# Patient Record
Sex: Male | Born: 1960 | Race: Black or African American | Hispanic: No | Marital: Married | Smoking: Never smoker
Health system: Southern US, Community
[De-identification: ages and names within clinical notes are randomized; demographics above are authoritative.]

## PROBLEM LIST (undated history)

## (undated) DIAGNOSIS — D869 Sarcoidosis, unspecified: Secondary | ICD-10-CM

## (undated) HISTORY — PX: BACK SURGERY: SHX140

---

## 2000-06-03 ENCOUNTER — Emergency Department (HOSPITAL_COMMUNITY): Admission: EM | Admit: 2000-06-03 | Discharge: 2000-06-04 | Payer: Self-pay | Admitting: Emergency Medicine

## 2000-06-03 ENCOUNTER — Encounter: Payer: Self-pay | Admitting: Internal Medicine

## 2001-02-14 ENCOUNTER — Emergency Department (HOSPITAL_COMMUNITY): Admission: EM | Admit: 2001-02-14 | Discharge: 2001-02-14 | Payer: Self-pay | Admitting: Emergency Medicine

## 2001-02-14 ENCOUNTER — Encounter: Payer: Self-pay | Admitting: Emergency Medicine

## 2001-06-10 ENCOUNTER — Ambulatory Visit: Admission: RE | Admit: 2001-06-10 | Discharge: 2001-06-10 | Payer: Self-pay | Admitting: Pulmonary Disease

## 2001-06-22 ENCOUNTER — Encounter: Admission: RE | Admit: 2001-06-22 | Discharge: 2001-06-22 | Payer: Self-pay | Admitting: Internal Medicine

## 2001-06-22 ENCOUNTER — Encounter: Payer: Self-pay | Admitting: Internal Medicine

## 2001-08-23 ENCOUNTER — Ambulatory Visit (HOSPITAL_COMMUNITY): Admission: RE | Admit: 2001-08-23 | Discharge: 2001-08-23 | Payer: Self-pay | Admitting: Internal Medicine

## 2001-08-23 ENCOUNTER — Encounter: Payer: Self-pay | Admitting: Internal Medicine

## 2002-07-17 ENCOUNTER — Encounter: Admission: RE | Admit: 2002-07-17 | Discharge: 2002-07-17 | Payer: Self-pay | Admitting: Internal Medicine

## 2002-07-17 ENCOUNTER — Encounter: Payer: Self-pay | Admitting: Internal Medicine

## 2010-10-08 ENCOUNTER — Emergency Department (HOSPITAL_COMMUNITY)
Admission: EM | Admit: 2010-10-08 | Discharge: 2010-10-09 | Disposition: A | Discharge status: Home / Self Care | Payer: No Typology Code available for payment source | Attending: Emergency Medicine | Admitting: Emergency Medicine

## 2010-10-08 DIAGNOSIS — M549 Dorsalgia, unspecified: Secondary | ICD-10-CM | POA: Insufficient documentation

## 2010-10-08 DIAGNOSIS — S335XXA Sprain of ligaments of lumbar spine, initial encounter: Secondary | ICD-10-CM | POA: Insufficient documentation

## 2016-09-01 ENCOUNTER — Ambulatory Visit: Payer: Self-pay | Admitting: Podiatry

## 2016-10-06 ENCOUNTER — Ambulatory Visit (INDEPENDENT_AMBULATORY_CARE_PROVIDER_SITE_OTHER): Payer: Medicare Other

## 2016-10-06 ENCOUNTER — Encounter: Payer: Self-pay | Admitting: Podiatry

## 2016-10-06 ENCOUNTER — Ambulatory Visit (INDEPENDENT_AMBULATORY_CARE_PROVIDER_SITE_OTHER): Payer: Medicare Other | Admitting: Podiatry

## 2016-10-06 DIAGNOSIS — M2010 Hallux valgus (acquired), unspecified foot: Secondary | ICD-10-CM | POA: Diagnosis not present

## 2016-10-06 NOTE — Patient Instructions (Signed)

## 2016-10-06 NOTE — Progress Notes (Signed)
   Subjective:    Patient ID: Connor Baldwin, male    DOB: [birth year], 56 y.o.   MRN: [id number]  HPI  Chief Complaint  Patient presents with  . Bunions    BL onset 25 years   He presents today with a 25 year history of bunions he says it will bother me since the [employer]. The right one seems to be the worst. He states it feels like him walking on of bone. He's try to accommodate with appropriate shoe gear but he is still limited in his activities and ability to perform his daily duties.    Review of Systems  Eyes: Positive for itching.  Musculoskeletal: Positive for arthralgias, back pain and myalgias.  Psychiatric/Behavioral:       PTSD  All other systems reviewed and are negative.      Objective:   Physical Exam: Vital signs are stable he is alert and oriented 3. Neurologic system is intact vascular status is intact deep tendon reflexes are intact muscle strength is normal bilateral. Orthopedic evaluation shows almost distal to the ankle for range of motion or crepitus mild pes planus bilateral. He has HAV deformities bilateral that appear to be marginally hypermobile with pain on palpation and range of motion of the first metatarsophalangeal joint right greater than left. Radiographs taken today 3 views of the office demonstrates severe increase in the first metatarsal angle with severe hallux abductus. Right greater than left. Also demonstrates pes planus. No more osseous other modalities are noted.        Assessment & Plan:  Assessment hallux abductovalgus deformity with pes planus. Right greater than left.  Plan: Discussed etiology pathology conservative versus surgical therapies. Scheduled him for a Lapidus procedure with a cast. We did discuss the alternative treatment consisting of a Austin osteotomy if possible. He understands this is amenable to it we did discuss the possible postop palpitations which may include but are not limited to postop pain bleeding swelling  infection recurrence from surgery overcorrection and under correction loss of digit loss of limb loss of life. He is an occupational consent form we gave him paperwork regarding the surgery center and anesthesia group.

## 2016-10-13 ENCOUNTER — Telehealth: Payer: Self-pay

## 2016-10-13 NOTE — Telephone Encounter (Signed)
LVM returning patient's call that he wants to reschedule surgery from 10/23/16 to the first Friday in September.

## 2016-10-19 ENCOUNTER — Telehealth: Payer: Self-pay | Admitting: *Deleted

## 2016-10-19 NOTE — Telephone Encounter (Signed)
I am calling to verify that you wanted to change your surgery date from August 10 to September.  "I think Shanda BumpsJessica already did it."  So you're moving to November 20, 2016?  "Yes, that's correct."

## 2016-10-29 ENCOUNTER — Ambulatory Visit: Payer: Medicare Other

## 2016-11-19 ENCOUNTER — Other Ambulatory Visit: Payer: Self-pay | Admitting: Podiatry

## 2016-11-19 MED ORDER — OXYCODONE-ACETAMINOPHEN 10-325 MG PO TABS: 1.0000 | ORAL_TABLET | ORAL | 0 refills | Status: DC | PRN

## 2016-11-19 MED ORDER — PROMETHAZINE HCL 25 MG PO TABS: 25.0000 mg | ORAL_TABLET | Freq: Three times a day (TID) | ORAL | 0 refills | Status: AC | PRN

## 2016-11-19 MED ORDER — CEPHALEXIN 500 MG PO CAPS: 500.0000 mg | ORAL_CAPSULE | Freq: Three times a day (TID) | ORAL | 0 refills | Status: DC

## 2016-11-20 ENCOUNTER — Encounter: Payer: Self-pay | Admitting: Podiatry

## 2016-11-20 DIAGNOSIS — M2011 Hallux valgus (acquired), right foot: Secondary | ICD-10-CM | POA: Diagnosis not present

## 2016-11-26 ENCOUNTER — Ambulatory Visit (INDEPENDENT_AMBULATORY_CARE_PROVIDER_SITE_OTHER): Payer: Medicare Other

## 2016-11-26 ENCOUNTER — Encounter: Payer: Self-pay | Admitting: Podiatry

## 2016-11-26 ENCOUNTER — Ambulatory Visit (INDEPENDENT_AMBULATORY_CARE_PROVIDER_SITE_OTHER): Payer: Self-pay | Admitting: Podiatry

## 2016-11-26 VITALS — BP 163/90 | HR 72 | Resp 16

## 2016-11-26 DIAGNOSIS — M2011 Hallux valgus (acquired), right foot: Secondary | ICD-10-CM

## 2016-11-26 NOTE — Progress Notes (Signed)
He presents today for his first postop visit [facility]. He states it is doing just fine. He denies fever chills nausea vomiting muscle aches and pains denies shortness of breath chest pain.  Objective: Date of surgery 11/20/2016 dry sterile dressing intact was removed reveals mild edema no erythema cellulitis drainage or odor knee has good range of motion of the first metatarsophalangeal joint actively and passively. Radiographs 3 views of the right foot taken today in the office demonstrates an osseously mature foot with osteotomy of the first metatarsal and internal fixation with the screw. Is in good alignment and good position and appears to be good compression.  Assessment: Well-healing surgical foot 1 week.  Plan: Redressed today dresser compressive dressing continue use of cam walker and I will follow-up with him in 1 week.

## 2016-12-03 ENCOUNTER — Ambulatory Visit (INDEPENDENT_AMBULATORY_CARE_PROVIDER_SITE_OTHER): Payer: Medicare Other | Admitting: Podiatry

## 2016-12-03 DIAGNOSIS — M2011 Hallux valgus (acquired), right foot: Secondary | ICD-10-CM | POA: Diagnosis not present

## 2016-12-03 DIAGNOSIS — M2010 Hallux valgus (acquired), unspecified foot: Secondary | ICD-10-CM

## 2016-12-03 DIAGNOSIS — M2012 Hallux valgus (acquired), left foot: Secondary | ICD-10-CM

## 2016-12-03 NOTE — Progress Notes (Signed)
He presents today for follow-up status post [facility] bunion repair right foot. Date of surgery 11/20/2016. He states that he would like to consider having his left foot corrected in the near future. We have discussed this left the previously and other notes. He states that his right foot is doing very well he denies fever chills nausea vomiting muscle aches and pains.  Objective: Vital signs are stable he is alert and oriented 3 sutures are intact margins well coapted has great range of motion of the first metatarsophalangeal joint right foot. No signs of infection. He has pain on range of motion and on palpation of the first metatarsophalangeal joint of the left foot. Radiographs for this were reviewed.  Assessment: Well-healing surgical foot right. Hallux abductovalgus deformity left leg.  Plan: Discussed etiology pathology conservative versus surgical therapies. At this point he signed a consent form for his left foot just the same as the right. He knows the possible postop implications which may include but are not limited to postop pain bleeding swelling section recurrence need for further surgery overcorrection under correction loss of digit loss of limb loss of life. We placed his right foot and a compression anklet today and a Darco shoe will follow up with him in 2 weeks for another postop visit and less we do surgery on the left foot earlier.

## 2016-12-17 ENCOUNTER — Ambulatory Visit (INDEPENDENT_AMBULATORY_CARE_PROVIDER_SITE_OTHER): Payer: Medicare Other | Admitting: Podiatry

## 2016-12-17 ENCOUNTER — Ambulatory Visit (INDEPENDENT_AMBULATORY_CARE_PROVIDER_SITE_OTHER): Payer: Medicare Other

## 2016-12-17 DIAGNOSIS — M2011 Hallux valgus (acquired), right foot: Secondary | ICD-10-CM

## 2016-12-18 NOTE — Progress Notes (Signed)
He presents today stating that he is starting to have some soreness particularly since being up on the floor.  Objective: Vital signs are stable he is alert and oriented 3 there is mild edema no erythema cellulitis drainage or odor has great range of motion first metatarsophalangeal joint that is somewhat tender. His wife presents with him today stating that he may be doing too much. Radiographs demonstrate well-healing osteotomy with internal fixation in good position.  Assessment: Well-healed surgical foot left.  Plan: Discussed etiology pathology conservative versus surgical therapies. I encouraged range of motion exercises and will allow him back into a regular shoe.

## 2016-12-22 NOTE — Progress Notes (Signed)
DOS 11/20/16 Lapidus procedure including bunionectomy Rt foot w cast

## 2017-01-20 ENCOUNTER — Other Ambulatory Visit: Payer: Medicare Other

## 2017-01-21 ENCOUNTER — Encounter: Payer: Self-pay | Admitting: Podiatry

## 2017-01-21 ENCOUNTER — Ambulatory Visit (INDEPENDENT_AMBULATORY_CARE_PROVIDER_SITE_OTHER): Payer: Medicare Other

## 2017-01-21 ENCOUNTER — Ambulatory Visit: Payer: Medicare Other

## 2017-01-21 ENCOUNTER — Ambulatory Visit (INDEPENDENT_AMBULATORY_CARE_PROVIDER_SITE_OTHER): Payer: Medicare Other | Admitting: Podiatry

## 2017-01-21 DIAGNOSIS — M2011 Hallux valgus (acquired), right foot: Secondary | ICD-10-CM | POA: Diagnosis not present

## 2017-01-21 MED ORDER — MELOXICAM 15 MG PO TABS: 15.0000 mg | ORAL_TABLET | Freq: Every day | ORAL | 3 refills | Status: AC

## 2017-01-21 NOTE — Progress Notes (Signed)
He presents today for postop visit date of surgery 11/20/2016 status post [facility] bunion[facility] of the right foot. He states that other than some swelling as seems to be doing very well.  Objective: Vital signs are stable he is alert and oriented 3. His great range of motion of the first metatarsophalangeal joint with minimal edema. Incision site is gone on to heal uneventfully there is no signs of infection. Radiographs confirm screw fixation is intact with Y osteotomy appears to be healed.  Assessment: Well-healing surgical foot right.  Plan: We will allow him to get back to his regular activity will notify me with questions or concerns. I will follow-up with him in 1 month for his final set of x-rays if necessary.

## 2017-01-21 NOTE — Patient Instructions (Signed)

## 2017-02-18 ENCOUNTER — Ambulatory Visit (INDEPENDENT_AMBULATORY_CARE_PROVIDER_SITE_OTHER): Payer: Medicare Other

## 2017-02-18 ENCOUNTER — Ambulatory Visit (INDEPENDENT_AMBULATORY_CARE_PROVIDER_SITE_OTHER): Payer: Medicare Other | Admitting: Podiatry

## 2017-02-18 ENCOUNTER — Encounter: Payer: Self-pay | Admitting: Podiatry

## 2017-02-18 DIAGNOSIS — M2012 Hallux valgus (acquired), left foot: Secondary | ICD-10-CM

## 2017-02-18 DIAGNOSIS — M2011 Hallux valgus (acquired), right foot: Secondary | ICD-10-CM | POA: Diagnosis not present

## 2017-02-18 NOTE — Progress Notes (Signed)
He presents today for surgical consult regarding his left foot.  He states that he would like to go ahead and have this taken care of.  He states that it started bothering him more with regular shoe gear is starting to affect his ability to perform his daily activities.  We performed a bunion repair to his right foot in September he is doing very well with that.  Objective: Vital signs are stable he is alert and oriented x3.  I have reviewed his past medical history medications allergies surgeries and social history.  There is been no changes in his past medical history.  Pulses are strongly palpable.  Hallux abductovalgus deformity is present.  I reviewed radiographs from July 2018 for the left foot.  Increase in the first intermetatarsal angle is present and above normal values as well as hallux abductus angles greater than normal values.  Assessment: Hallux abductovalgus deformity well-healing right in need of surgical repair left.  Plan: At this point we consented [facility] today [facility] with double screw fixation he understands this and is amenable to it.  We did once again discussed the possible postop complications which may include but are not limited to postop pain bleeding swelling infection recurrence and need for further surgery.  He understands this and is amenable to it.  He was provided both oral and written home-going instructions for his preoperative.

## 2017-02-18 NOTE — Patient Instructions (Signed)
Pre-Operative Instructions  Congratulations, you have decided to take an important step towards improving your quality of life.  You can be assured that the doctors and staff at [facility] will be with you every step of the way.  Here are some important things you should know:  1. Plan to be at the surgery center/hospital at least 1 (one) hour prior to your scheduled time, unless otherwise directed by the surgical center/hospital staff.  You must have a responsible adult accompany you, remain during the surgery and drive you home.  Make sure you have directions to the surgical center/hospital to ensure you arrive on time. 2. If you are having surgery at [facility] or [facility], you will need a copy of your medical history and physical form from your family physician within one month prior to the date of surgery. We will give you a form for your primary physician to complete.  3. We make every effort to accommodate the date you request for surgery.  However, there are times where surgery dates or times have to be moved.  We will contact you as soon as possible if a change in schedule is required.   4. No aspirin/ibuprofen for one week before surgery.  If you are on aspirin, any non-steroidal anti-inflammatory medications (Mobic, Aleve, Ibuprofen) should not be taken seven (7) days prior to your surgery.  You make take Tylenol for pain prior to surgery.  5. Medications - If you are taking daily heart and blood pressure medications, seizure, reflux, allergy, asthma, anxiety, pain or diabetes medications, make sure you notify the surgery center/hospital before the day of surgery so they can tell you which medications you should take or avoid the day of surgery. 6. No food or drink after midnight the night before surgery unless directed otherwise by surgical center/hospital staff. 7. No alcoholic beverages 24-hours prior to surgery.  No smoking 24-hours prior or 24-hours after  surgery. 8. Wear loose pants or shorts. They should be loose enough to fit over bandages, boots, and casts. 9. Don't wear slip-on shoes. Sneakers are preferred. 10. Bring your boot with you to the surgery center/hospital.  Also bring crutches or a walker if your physician has prescribed it for you.  If you do not have this equipment, it will be provided for you after surgery. 11. If you have not been contacted by the surgery center/hospital by the day before your surgery, call to confirm the date and time of your surgery. 12. Leave-time from work may vary depending on the type of surgery you have.  Appropriate arrangements should be made prior to surgery with your employer. 13. Prescriptions will be provided immediately following surgery by your doctor.  Fill these as soon as possible after surgery and take the medication as directed. Pain medications will not be refilled on weekends and must be approved by the doctor. 14. Remove nail polish on the operative foot and avoid getting pedicures prior to surgery. 15. Wash the night before surgery.  The night before surgery wash the foot and leg well with water and the antibacterial soap provided. Be sure to pay special attention to beneath the toenails and in between the toes.  Wash for at least three (3) minutes. Rinse thoroughly with water and dry well with a towel.  Perform this wash unless told not to do so by your physician.  Enclosed: 1 Ice pack (please put in freezer the night before surgery)   1 Hibiclens skin cleaner     Pre-op instructions  If you have any questions regarding the instructions, please do not hesitate to call our office.  [place]: [address], [place], [place] [address] -- [phone]  [place]: [address] [place], [place] [address] -- [phone]  [place]: [address]  [place], [place] [address] -- [phone]  [place]: [address], [place], [place] [address] -- [phone]  Website:  [email or web address] 

## 2017-03-17 ENCOUNTER — Telehealth: Payer: Self-pay

## 2017-03-17 NOTE — Telephone Encounter (Signed)
Patient called and LVM wanting to cancel his surgery. He said that he will call back to reschedule but the timing was not good at this point in time for him to have surgery

## 2017-03-25 ENCOUNTER — Other Ambulatory Visit: Payer: Medicare Other

## 2019-04-20 ENCOUNTER — Ambulatory Visit (INDEPENDENT_AMBULATORY_CARE_PROVIDER_SITE_OTHER): Payer: Medicare Other | Admitting: Podiatry

## 2019-04-20 ENCOUNTER — Other Ambulatory Visit: Payer: Self-pay

## 2019-04-20 ENCOUNTER — Ambulatory Visit (INDEPENDENT_AMBULATORY_CARE_PROVIDER_SITE_OTHER): Payer: Medicare Other

## 2019-04-20 ENCOUNTER — Encounter: Payer: Self-pay | Admitting: Podiatry

## 2019-04-20 DIAGNOSIS — Z01818 Encounter for other preprocedural examination: Secondary | ICD-10-CM

## 2019-04-20 DIAGNOSIS — M2012 Hallux valgus (acquired), left foot: Secondary | ICD-10-CM

## 2019-04-20 NOTE — Patient Instructions (Signed)
Pre-Operative Instructions  Congratulations, you have decided to take an important step towards improving your quality of life.  You can be assured that the doctors and staff at [facility] will be with you every step of the way.  Here are some important things you should know:  1. Plan to be at the surgery center/hospital at least 1 (one) hour prior to your scheduled time, unless otherwise directed by the surgical center/hospital staff.  You must have a responsible adult accompany you, remain during the surgery and drive you home.  Make sure you have directions to the surgical center/hospital to ensure you arrive on time. 2. If you are having surgery at [facility] or [facility], you will need a copy of your medical history and physical form from your family physician within one month prior to the date of surgery. We will give you a form for your primary physician to complete.  3. We make every effort to accommodate the date you request for surgery.  However, there are times where surgery dates or times have to be moved.  We will contact you as soon as possible if a change in schedule is required.   4. No aspirin/ibuprofen for one week before surgery.  If you are on aspirin, any non-steroidal anti-inflammatory medications (Mobic, Aleve, Ibuprofen) should not be taken seven (7) days prior to your surgery.  You make take Tylenol for pain prior to surgery.  5. Medications - If you are taking daily heart and blood pressure medications, seizure, reflux, allergy, asthma, anxiety, pain or diabetes medications, make sure you notify the surgery center/hospital before the day of surgery so they can tell you which medications you should take or avoid the day of surgery. 6. No food or drink after midnight the night before surgery unless directed otherwise by surgical center/hospital staff. 7. No alcoholic beverages 24-hours prior to surgery.  No smoking 24-hours prior or 24-hours after  surgery. 8. Wear loose pants or shorts. They should be loose enough to fit over bandages, boots, and casts. 9. Don't wear slip-on shoes. Sneakers are preferred. 10. Bring your boot with you to the surgery center/hospital.  Also bring crutches or a walker if your physician has prescribed it for you.  If you do not have this equipment, it will be provided for you after surgery. 11. If you have not been contacted by the surgery center/hospital by the day before your surgery, call to confirm the date and time of your surgery. 12. Leave-time from work may vary depending on the type of surgery you have.  Appropriate arrangements should be made prior to surgery with your employer. 13. Prescriptions will be provided immediately following surgery by your doctor.  Fill these as soon as possible after surgery and take the medication as directed. Pain medications will not be refilled on weekends and must be approved by the doctor. 14. Remove nail polish on the operative foot and avoid getting pedicures prior to surgery. 15. Wash the night before surgery.  The night before surgery wash the foot and leg well with water and the antibacterial soap provided. Be sure to pay special attention to beneath the toenails and in between the toes.  Wash for at least three (3) minutes. Rinse thoroughly with water and dry well with a towel.  Perform this wash unless told not to do so by your physician.  Enclosed: 1 Ice pack (please put in freezer the night before surgery)   1 Hibiclens skin cleaner     Pre-op instructions  If you have any questions regarding the instructions, please do not hesitate to call our office.  [place]: [address], [place], [place] [address] -- [phone]  [place]: [address] [place], [place] [address] -- [phone]  [place]: [address], [place], [place] [address] -- [phone]   Website: [email or web address] 

## 2019-04-21 ENCOUNTER — Telehealth: Payer: Self-pay | Admitting: Podiatry

## 2019-04-21 NOTE — Telephone Encounter (Signed)
I'm calling to reschedule my sx. I wanted to see if I could get it scheduled for the 19th. Thank you.

## 2019-04-21 NOTE — Telephone Encounter (Signed)
Called pt back about him wanting to reschedule his sx date. Pt requested to reschedule to the following Friday 02/19 because it was a better day for him. I told the pt he could go ahead and register online with the surgical center if he hadn't already. I also informed the pt someone would call him a day or two prior to his surgery to let him know what time to arrive.  I have rescheduled pt's sx on Dr. Geryl Rankins schedule in both the surgery book and Epic and I've also notified Aram Beecham at [facility].

## 2019-04-22 NOTE — Progress Notes (Signed)
Subjective:  Patient ID: Connor Baldwin, male    DOB: [birth year],  MRN: [id number] HPI Chief Complaint  Patient presents with  . Consult    Discuss bunion surgery left - patient wanted to get DME from [place], provided a Rx for that    59 y.o. male presents with the above complaint.   ROS: Denies fever chills nausea vomiting muscle aches pains calf pain back pain chest pain shortness of breath.  No past medical history on file. Past Surgical History:  Procedure Laterality Date  . BACK SURGERY      Current Outpatient Medications:  .  hydrOXYzine (ATARAX/VISTARIL) 25 MG tablet, , Disp: , Rfl:  .  albuterol (PROVENTIL HFA;VENTOLIN HFA) 108 (90 Base) MCG/ACT inhaler, Inhale into the lungs., Disp: , Rfl:  .  ergocalciferol (VITAMIN D2) 1.25 MG (50000 UT) capsule, Take by mouth., Disp: , Rfl:  .  gabapentin (NEURONTIN) 800 MG tablet, Take by mouth., Disp: , Rfl:  .  ketoconazole (NIZORAL) 2 % shampoo, Apply topically., Disp: , Rfl:  .  loratadine (CLARITIN) 10 MG tablet, Take by mouth., Disp: , Rfl:  .  Melatonin 3 MG TABS, Take by mouth., Disp: , Rfl:  .  meloxicam (MOBIC) 15 MG tablet, Take 1 tablet (15 mg total) daily by mouth., Disp: 30 tablet, Rfl: 3 .  Multiple Vitamin (MULTIVITAMIN) capsule, Take by mouth., Disp: , Rfl:  .  pantoprazole (PROTONIX) 40 MG tablet, Take 40 mg by mouth., Disp: , Rfl:  .  PARoxetine (PAXIL) 40 MG tablet, Take by mouth., Disp: , Rfl:  .  potassium chloride (KLOR-CON) 20 MEQ packet, Take by mouth., Disp: , Rfl:  .  promethazine (PHENERGAN) 25 MG tablet, Take 1 tablet (25 mg total) by mouth every 8 (eight) hours as needed., Disp: 20 tablet, Rfl: 0 .  QUEtiapine (SEROQUEL) 200 MG tablet, Take by mouth., Disp: , Rfl:  .  senna-docusate (SENOKOT-S) 8.6-50 MG tablet, Take by mouth., Disp: , Rfl:  .  SUMAtriptan (IMITREX) 100 MG tablet, Take by mouth., Disp: , Rfl:  .  terazosin (HYTRIN) 2 MG capsule, , Disp: , Rfl:  .  timolol (TIMOPTIC) 0.5 % ophthalmic  solution, 1 drop Two (2) times a day., Disp: , Rfl:  .  tolnaftate (TINACTIN) 1 % powder, Apply topically., Disp: , Rfl:  .  zolmitriptan (ZOMIG) 5 MG tablet, Take 5 mg by mouth as needed for migraine., Disp: , Rfl:  .  zolpidem (AMBIEN) 10 MG tablet, Take 10 mg by mouth., Disp: , Rfl:   Allergies  Allergen Reactions  . Sulfa Antibiotics Itching    And can't sleep   Review of Systems Objective:  There were no vitals filed for this visit.  General: Well developed, nourished, in no acute distress, alert and oriented x3   Dermatological: Skin is warm, dry and supple bilateral. Nails x 10 are well maintained; remaining integument appears unremarkable at this time. There are no open sores, no preulcerative lesions, no rash or signs of infection present.  Vascular: Dorsalis Pedis artery and Posterior Tibial artery pedal pulses are 2/4 bilateral with immedate capillary fill time. Pedal hair growth present. No varicosities and no lower extremity edema present bilateral.   Neruologic: Grossly intact via light touch bilateral. Vibratory intact via tuning fork bilateral. Protective threshold with Semmes Wienstein monofilament intact to all pedal sites bilateral. Patellar and Achilles deep tendon reflexes 2+ bilateral. No Babinski or clonus noted bilateral.   Musculoskeletal: No gross boney pedal deformities bilateral. No pain,  crepitus, or limitation noted with foot and ankle range of motion bilateral. Muscular strength 5/5 in all groups tested bilateral.  Gait: Unassisted, Nonantalgic.    Radiographs:  Radiographs taken today demonstrate an increase in the first intermetatarsal angle greater than normal value.  Early osteoarthritic changes with joint space narrowing subchondral sclerosis and dorsal eburnation all consistent with osteoarthritic changes.  Hallux abductus angle is greater than normal value.  Hypertrophic medial condyle was present tibial sesamoid position is approximately a  5.  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  Conservative therapies such as anti-inflammatories and shoe gear changes have failed so we consented him today for [facility] bunion repair double screw fixation and I will follow-up with him in the near future for surgical intervention.  We did discuss the possible postop complications which may include but are not limited to postop pain bleeding swelling infection recurrence need for further surgery overcorrection under correction also digit loss of limb loss of life.     Lai Hendriks T. [place], [place]

## 2019-05-03 ENCOUNTER — Other Ambulatory Visit: Payer: Self-pay | Admitting: Podiatry

## 2019-05-03 MED ORDER — OXYCODONE-ACETAMINOPHEN 10-325 MG PO TABS: 1.0000 | ORAL_TABLET | Freq: Three times a day (TID) | ORAL | 0 refills | Status: AC | PRN

## 2019-05-03 MED ORDER — CEPHALEXIN 500 MG PO CAPS: 500.0000 mg | ORAL_CAPSULE | Freq: Three times a day (TID) | ORAL | 0 refills | Status: DC

## 2019-05-03 MED ORDER — ONDANSETRON HCL 4 MG PO TABS: 4.0000 mg | ORAL_TABLET | Freq: Three times a day (TID) | ORAL | 0 refills | Status: AC | PRN

## 2019-05-04 ENCOUNTER — Encounter: Payer: Medicare Other | Admitting: Podiatry

## 2019-05-05 DIAGNOSIS — M2012 Hallux valgus (acquired), left foot: Secondary | ICD-10-CM

## 2019-05-11 ENCOUNTER — Other Ambulatory Visit: Payer: Self-pay

## 2019-05-11 ENCOUNTER — Ambulatory Visit (INDEPENDENT_AMBULATORY_CARE_PROVIDER_SITE_OTHER): Payer: Medicare Other | Admitting: Podiatry

## 2019-05-11 ENCOUNTER — Encounter: Payer: Self-pay | Admitting: Podiatry

## 2019-05-11 ENCOUNTER — Ambulatory Visit (INDEPENDENT_AMBULATORY_CARE_PROVIDER_SITE_OTHER): Payer: Medicare Other

## 2019-05-11 VITALS — Temp 97.1°F

## 2019-05-11 DIAGNOSIS — M2012 Hallux valgus (acquired), left foot: Secondary | ICD-10-CM

## 2019-05-11 DIAGNOSIS — Z9889 Other specified postprocedural states: Secondary | ICD-10-CM

## 2019-05-11 NOTE — Progress Notes (Signed)
He presents today for his first postop visit date of surgery is May 05, 2019 status post Marylouise Stacks osteotomy first metatarsophalangeal joint of his left foot.  He denies fever chills nausea vomiting muscle aches and pains states that it was a little tender to the first couple of days but now seems to be doing pretty well.  Objective: Presents today in his cam walker was removed demonstrates dressed a compressive dressing intact was removed demonstrates no erythema just mild edema no cellulitis drainage or odor incision is intact good range of motion first metatarsophalangeal joint nonsymptomatic.  Radiographs taken today demonstrate a first metatarsal capital osteotomy with screw fixation screw fixation appears to be intact and tight.  Assessment: Well-healing surgical foot.  Plan: Follow-up with him in 1 week.  Redressed today dressed a compressive dressing continue to keep dry and elevated.  Continue range of motion exercises.

## 2019-05-12 ENCOUNTER — Other Ambulatory Visit: Payer: Self-pay

## 2019-05-12 ENCOUNTER — Telehealth: Payer: Self-pay | Admitting: Podiatry

## 2019-05-12 ENCOUNTER — Ambulatory Visit (HOSPITAL_COMMUNITY)
Admission: EM | Admit: 2019-05-12 | Discharge: 2019-05-12 | Disposition: A | Discharge status: Home / Self Care | Payer: Medicare Other | Attending: Physician Assistant | Admitting: Physician Assistant

## 2019-05-12 ENCOUNTER — Encounter (HOSPITAL_COMMUNITY): Payer: Self-pay

## 2019-05-12 ENCOUNTER — Ambulatory Visit (INDEPENDENT_AMBULATORY_CARE_PROVIDER_SITE_OTHER): Payer: Medicare Other

## 2019-05-12 DIAGNOSIS — K649 Unspecified hemorrhoids: Secondary | ICD-10-CM

## 2019-05-12 DIAGNOSIS — K5903 Drug induced constipation: Secondary | ICD-10-CM | POA: Diagnosis not present

## 2019-05-12 DIAGNOSIS — K602 Anal fissure, unspecified: Secondary | ICD-10-CM

## 2019-05-12 MED ORDER — LIDOCAINE HCL 2 % EX GEL: 1.0000 "application " | CUTANEOUS | 0 refills | Status: DC | PRN

## 2019-05-12 MED ORDER — NITROGLYCERIN 0.4 % RE OINT: 1.0000 | TOPICAL_OINTMENT | Freq: Two times a day (BID) | RECTAL | 0 refills | Status: AC

## 2019-05-12 MED ORDER — POLYETHYLENE GLYCOL 3350 17 GM/SCOOP PO POWD: ORAL | 0 refills | Status: AC

## 2019-05-12 MED ORDER — NITROGLYCERIN 0.4 % RE OINT: 1.0000 | TOPICAL_OINTMENT | Freq: Two times a day (BID) | RECTAL | 0 refills | Status: DC

## 2019-05-12 MED ORDER — MAGNESIUM CITRATE PO SOLN: 1.0000 | Freq: Once | ORAL | 0 refills | Status: AC

## 2019-05-12 MED ORDER — MAGNESIUM CITRATE PO SOLN: 1.0000 | Freq: Once | ORAL | 0 refills | Status: DC

## 2019-05-12 MED ORDER — POLYETHYLENE GLYCOL 3350 17 GM/SCOOP PO POWD: ORAL | 0 refills | Status: DC

## 2019-05-12 MED ORDER — LIDOCAINE HCL 2 % EX GEL: 1.0000 "application " | CUTANEOUS | 0 refills | Status: AC | PRN

## 2019-05-12 NOTE — ED Triage Notes (Signed)
Pt states since Monday he has been constipated & has hemorrhoids, states he has been taken laxatives & barely releases anything.

## 2019-05-12 NOTE — ED Provider Notes (Signed)
[facility]    CSN: [id number] Arrival date & time: 05/12/19  1653      History   Chief Complaint Chief Complaint  Patient presents with  . Constipation    HPI Connor FITZHENRY is a 59 y.o. male.   Patient presents today to urgent care for 1 week of constipation.  He reports his last full bowel movement he had was Monday morning.  He reports a very small amount of stool passed yesterday.  This required significant amount of straining and almost caused him to pass out.  It is very painful for him to have a bowel movement at this point as well due to hemorrhoids.  He reports blood on the paper.  He denies much abdominal pain but reports fullness and bloating.  He does not endorse nausea or vomiting.  He has reported some decrease in appetite but has also stopped eating due to his constipation.  He has tried Dulcolax but this is not seem to help.  He reports a recent foot surgery that he has been placed on opiate pain medications.  Reports that he has been avoiding attempting to use the bathroom at this point due to pain and the difficulty that he had with his last attempt.     History reviewed. No pertinent past medical history.  There are no problems to display for this patient.   Past Surgical History:  Procedure Laterality Date  . BACK SURGERY         Home Medications    Prior to Admission medications   Medication Sig Start Date End Date Taking? Authorizing Provider  albuterol (PROVENTIL HFA;VENTOLIN HFA) 108 (90 Base) MCG/ACT inhaler Inhale into the lungs.    [provider]  cephALEXin (KEFLEX) 500 MG capsule Take 1 capsule (500 mg total) by mouth 3 (three) times daily. 05/03/19   Hyatt, Max T, DPM  ergocalciferol (VITAMIN D2) 1.25 MG (50000 UT) capsule Take by mouth.    [provider]  gabapentin (NEURONTIN) 800 MG tablet Take by mouth.    [provider]  hydrOXYzine (ATARAX/VISTARIL) 25 MG tablet  11/06/15   [provider]  ketoconazole (NIZORAL) 2 % shampoo Apply topically.    [provider]  lidocaine (XYLOCAINE) 2 % jelly Apply 1 application topically as needed. 05/12/19   Alayjah Boehringer, Veryl Speak, PA-C  loratadine (CLARITIN) 10 MG tablet Take by mouth.    [provider]  magnesium citrate SOLN Take 296 mLs (1 Bottle total) by mouth once for 1 dose. 05/12/19 05/12/19  Yuri Flener, Veryl Speak, PA-C  Melatonin 3 MG TABS Take by mouth.    [provider]  meloxicam (MOBIC) 15 MG tablet Take 1 tablet (15 mg total) daily by mouth. 01/21/17   Hyatt, Max T, DPM  Multiple Vitamin (MULTIVITAMIN) capsule Take by mouth.    [provider]  Nitroglycerin 0.4 % OINT Place 1 applicator rectally in the morning and at bedtime. 05/12/19   Keileigh Vahey, Veryl Speak, PA-C  ondansetron (ZOFRAN) 4 MG tablet Take 1 tablet (4 mg total) by mouth every 8 (eight) hours as needed. 05/03/19   Hyatt, Max T, DPM  pantoprazole (PROTONIX) 40 MG tablet Take 40 mg by mouth.    [provider]  PARoxetine (PAXIL) 40 MG tablet Take by mouth.    [provider]  polyethylene glycol powder (MIRALAX) 17 GM/SCOOP powder Consume 2 caps daily until you have a bowel movement, then 1 cap daily adjusted to stool consistency 05/12/19  Lore Polka, Veryl Speak, PA-C  potassium chloride (KLOR-CON) 20 MEQ packet Take by mouth.    [provider]  promethazine (PHENERGAN) 25 MG tablet Take 1 tablet (25 mg total) by mouth every 8 (eight) hours as needed. 11/19/16   Hyatt, Max T, DPM  QUEtiapine (SEROQUEL) 200 MG tablet Take by mouth.    [provider]  senna-docusate (SENOKOT-S) 8.6-50 MG tablet Take by mouth.    [provider]  SUMAtriptan (IMITREX) 100 MG tablet Take by mouth.    [provider]  terazosin (HYTRIN) 2 MG capsule  08/05/16   [provider]  timolol (TIMOPTIC) 0.5 % ophthalmic solution 1 drop Two (2) times a day.    [provider]  tolnaftate (TINACTIN) 1 % powder Apply topically.     [provider]  zolmitriptan (ZOMIG) 5 MG tablet Take 5 mg by mouth as needed for migraine.    [provider]  zolpidem (AMBIEN) 10 MG tablet Take 10 mg by mouth.    [provider]    Family History Family History  Problem Relation Age of Onset  . Hypertension Mother   . Hypertension Father   . Cancer Father     Social History Social History   Tobacco Use  . Smoking status: Never Smoker  . Smokeless tobacco: Never Used  Substance Use Topics  . Alcohol use: Yes    Comment: 2 drinks q 2 weeks  . Drug use: No     Allergies   Sulfa antibiotics   Review of Systems Review of Systems  Constitutional: Negative for chills and fever.  Respiratory: Negative for cough and shortness of breath.   Cardiovascular: Negative for chest pain and palpitations.  Gastrointestinal: Positive for anal bleeding, constipation and rectal pain. Negative for abdominal pain, blood in stool, nausea and vomiting.  Neurological: Positive for headaches.     Physical Exam Triage Vital Signs ED Triage Vitals  Enc Vitals Group     BP 05/12/19 1712 (!) 156/99     Pulse Rate 05/12/19 1712 (!) 102     Resp 05/12/19 1712 18     Temp 05/12/19 1712 99.1 F (37.3 C)     Temp Source 05/12/19 1712 Oral     SpO2 05/12/19 1712 99 %     Weight 05/12/19 1712 176 lb 3.2 oz (79.9 kg)     Height --      Head Circumference --      Peak Flow --      Pain Score 05/12/19 1711 10     Pain Loc --      Pain Edu? --      Excl. in GC? --    No data found.  Updated Vital Signs BP (!) 156/99 (BP Location: Left Arm)   Pulse (!) 102   Temp 99.1 F (37.3 C) (Oral)   Resp 18   Wt 176 lb 3.2 oz (79.9 kg)   SpO2 99%   Visual Acuity Right Eye Distance:   Left Eye Distance:   Bilateral Distance:    Right Eye Near:   Left Eye Near:    Bilateral Near:     Physical Exam Vitals and nursing note reviewed.  Constitutional:      Appearance: Normal appearance. He is well-developed. He  is not ill-appearing.  HENT:     Head: Normocephalic and atraumatic.  Eyes:     Conjunctiva/sclera: Conjunctivae normal.  Cardiovascular:     Rate and Rhythm: Normal rate and regular  rhythm.     Heart sounds: No murmur.  Pulmonary:     Effort: Pulmonary effort is normal. No respiratory distress.     Breath sounds: Normal breath sounds.  Abdominal:     Palpations: Abdomen is soft.     Tenderness: There is no abdominal tenderness.  Genitourinary:    Comments: 2 relatively large thrombosed hemorrhoids.  Evidence of bleeding fissure at the 12 o'clock position. Musculoskeletal:     Cervical back: Neck supple.  Skin:    General: Skin is warm and dry.  Neurological:     General: No focal deficit present.     Mental Status: He is alert and oriented to person, place, and time.  Psychiatric:        Mood and Affect: Mood normal.        Behavior: Behavior normal.        Thought Content: Thought content normal.        Judgment: Judgment normal.      UC Treatments / Results  Labs (all labs ordered are listed, but only abnormal results are displayed) Labs Reviewed - No data to display  EKG   Radiology DG Abdomen 1 View  Result Date: 05/12/2019 CLINICAL DATA:  Severe constipation EXAM: ABDOMEN - 1 VIEW COMPARISON:  None. FINDINGS: There is an above average amount of stool throughout the colon. The bowel gas pattern is nonobstructive. Calcifications project over the patient's right hemipelvis and are favored to represent phleboliths. There is no acute osseous abnormality. Degenerative changes are noted of the hips and spine. IMPRESSION: Above average amount of stool throughout the colon. Electronically Signed   By: Katherine Mantle M.D.   On: 05/12/2019 18:20   DG Foot Complete Left  Result Date: 05/11/2019 Please see detailed radiograph report in office note.   Procedures Procedures (including critical care time)  Medications Ordered in UC Medications - No data to  display  Initial Impression / Assessment and Plan / UC Course  I have reviewed the triage vital signs and the nursing notes.  Pertinent labs & imaging results that were available during my care of the patient were reviewed by me and considered in my medical decision making (see chart for details).     #Drug-induced constipation #Anal fissure #Acute hemorrhoids Patient is a 59 year old male patient presenting with acute constipation.  He also has history of hemorrhoids and active external hemorrhoids causing pain with a new anal fissure secondary to strenuous bowel movements.  X-ray does show significant stool burden.  Patient has stopped his opiate pain medications for the time being.  We discussed a bowel regiment that is below and follow-up and emergency department precautions should he continue to have a lack of bowel movement. -Magnesium citrate, then MiraLAX regiment. -Lidocaine and nitroglycerin ointment jelly prescribed -Instructed to follow-up with primary care for possible hemorrhoid removal.   Final Clinical Impressions(s) / UC Diagnoses   Final diagnoses:  Drug-induced constipation  Anal fissure  Acute hemorrhoid     Discharge Instructions     I would like for you to try this regiment in order to have a bowel movement. -Consume the bottle of magnesium citrate -3 hours following this I would like for you to mix in Gatorade or a large bottle of water 4-5 caps of MiraLAX.  Consume this over 1 to 2 hours. -I would like for you then continue with 1 capful of MiraLAX daily while on your pain management -You are unable to have a bowel movement after this  regimen I would like for you to consider manually deimpacting your self. - consume plenty of water   For your rectal pain I have prescribed a lidocaine jelly as well as nitroglycerin ointment.  Apply the lidocaine jelly as much as needed throughout the day.  The nitroglycerin is twice a day  Please follow-up with your  primary care in order to discuss further management of your hemorrhoids.  If you have sudden severe abdominal pain, have a high fever I would like for you to go to the emergency department     ED Prescriptions    Medication Sig Dispense Auth. Provider   polyethylene glycol powder (MIRALAX) 17 GM/SCOOP powder  (Status: Discontinued) Consume 2 caps daily until you have a bowel movement, then 1 cap daily adjusted to stool consistency 507 g Sherrell Weir, Marguerita Beards, PA-C   magnesium citrate SOLN  (Status: Discontinued) Take 296 mLs (1 Bottle total) by mouth once for 1 dose. 195 mL Doshie Maggi, Marguerita Beards, PA-C   Nitroglycerin 0.4 % OINT  (Status: Discontinued) Place 1 applicator rectally in the morning and at bedtime. 30 g Kenlee Vogt, Marguerita Beards, PA-C   lidocaine (XYLOCAINE) 2 % jelly  (Status: Discontinued) Apply 1 application topically as needed. 30 mL Harvie Morua, Marguerita Beards, PA-C   lidocaine (XYLOCAINE) 2 % jelly Apply 1 application topically as needed. 30 mL Daemien Fronczak, Marguerita Beards, PA-C   magnesium citrate SOLN Take 296 mLs (1 Bottle total) by mouth once for 1 dose. 195 mL Kellyann Ordway, Marguerita Beards, PA-C   Nitroglycerin 0.4 % OINT Place 1 applicator rectally in the morning and at bedtime. 30 g Laiylah Roettger, Marguerita Beards, PA-C   polyethylene glycol powder (MIRALAX) 17 GM/SCOOP powder Consume 2 caps daily until you have a bowel movement, then 1 cap daily adjusted to stool consistency 507 g Maurico Perrell, Marguerita Beards, PA-C     PDMP not reviewed this encounter.   Purnell Shoemaker, PA-C 05/12/19 1851

## 2019-05-12 NOTE — Discharge Instructions (Addendum)
I would like for you to try this regiment in order to have a bowel movement. -Consume the bottle of magnesium citrate -3 hours following this I would like for you to mix in Gatorade or a large bottle of water 4-5 caps of MiraLAX.  Consume this over 1 to 2 hours. -I would like for you then continue with 1 capful of MiraLAX daily while on your pain management -You are unable to have a bowel movement after this regimen I would like for you to consider manually deimpacting your self. - consume plenty of water   For your rectal pain I have prescribed a lidocaine jelly as well as nitroglycerin ointment.  Apply the lidocaine jelly as much as needed throughout the day.  The nitroglycerin is twice a day  Please follow-up with your primary care in order to discuss further management of your hemorrhoids.  If you have sudden severe abdominal pain, have a high fever I would like for you to go to the emergency department

## 2019-05-12 NOTE — Telephone Encounter (Signed)
I spoke with pt's wife, Rhunette Croft, she states pt took one stool softener on Monday and had a stool. I told Jaquline that pt should take the stool softener as directed until he had a stool that was proper for him. Jaquline states if they had been informed of this they would have continued the stool softener, but pt is now in so much pain he is sweating and impacted. I apologized and explained that although constipation does occasionally occur, we don't order stool softener and she should contact his PCP or the ED or urgent care at this time, our doctors do not treat the impaction.

## 2019-05-12 NOTE — Telephone Encounter (Signed)
Pt had surgery on 05/05/19 and is currently taking Keflex,Zofran and oxycodone and is having trouble passing a bowel movement. Pt has become uncomfortable and in pain and would like to know if there is something the doctor can recommend or see if his medications need to be changed.    Please give patients wife a call back.

## 2019-05-18 ENCOUNTER — Encounter: Payer: Self-pay | Admitting: Podiatry

## 2019-05-18 ENCOUNTER — Other Ambulatory Visit: Payer: Self-pay

## 2019-05-18 ENCOUNTER — Ambulatory Visit (INDEPENDENT_AMBULATORY_CARE_PROVIDER_SITE_OTHER): Payer: Medicare Other | Admitting: Podiatry

## 2019-05-18 VITALS — Temp 96.8°F

## 2019-05-18 DIAGNOSIS — Z9889 Other specified postprocedural states: Secondary | ICD-10-CM

## 2019-05-18 DIAGNOSIS — M2012 Hallux valgus (acquired), left foot: Secondary | ICD-10-CM

## 2019-05-29 DIAGNOSIS — M2012 Hallux valgus (acquired), left foot: Secondary | ICD-10-CM | POA: Insufficient documentation

## 2019-05-29 NOTE — Progress Notes (Signed)
Subjective: Connor Baldwin is a 59 y.o. is seen today in office s/p left foot [place] bunionectomy preformed on 05/05/2019 with Dr. Al Corpus.  He states that overall he is doing well.  He has a little pressure but no significant pain.  He has no other concerns today.  Is been wearing surgical boot.  Denies any systemic complaints such as fevers, chills, nausea, vomiting. No calf pain, chest pain, shortness of breath.   Objective: General: No acute distress, AAOx3  DP/PT pulses palpable 2/4, CRT < 3 sec to all digits.  Protective sensation intact. Motor function intact.  LEFT foot: Incision is well coapted without any evidence of dehiscence with sutures intact. There is no surrounding erythema, ascending cellulitis, fluctuance, crepitus, malodor, drainage/purulence. There is mild edema around the surgical site. There is minimal pain along the surgical site.  Toe is in rectus position No other areas of tenderness to bilateral lower extremities.  No other open lesions or pre-ulcerative lesions.  No pain with calf compression, swelling, warmth, erythema.   Assessment and Plan:  Status post left foot surgery, doing well with no complications   -Treatment options discussed including all alternatives, risks, and complications -Suture ends removed.  Consider shower.  Dry thoroughly and apply a small amount of antibiotic ointment daily. -Remain offloading boot. -Ice/elevation -Pain medication as needed. -Monitor for any clinical signs or symptoms of infection and DVT/PE and directed to call the office immediately should any occur or go to the ER. -Follow-up as scheduled with Dr. Al Corpus or sooner if any problems arise. In the meantime, encouraged to call the office with any questions, concerns, change in symptoms.   *X-ray next appointment  Ovid Curd, DPM

## 2019-05-30 ENCOUNTER — Encounter: Payer: Medicare Other | Admitting: Podiatry

## 2019-06-01 ENCOUNTER — Other Ambulatory Visit: Payer: Self-pay

## 2019-06-01 ENCOUNTER — Ambulatory Visit (INDEPENDENT_AMBULATORY_CARE_PROVIDER_SITE_OTHER): Payer: Medicare Other

## 2019-06-01 ENCOUNTER — Ambulatory Visit (INDEPENDENT_AMBULATORY_CARE_PROVIDER_SITE_OTHER): Payer: Medicare Other | Admitting: Podiatry

## 2019-06-01 DIAGNOSIS — M2012 Hallux valgus (acquired), left foot: Secondary | ICD-10-CM

## 2019-06-01 DIAGNOSIS — Z9889 Other specified postprocedural states: Secondary | ICD-10-CM

## 2019-06-01 NOTE — Progress Notes (Signed)
He presents today date of surgery 05/05/2019 status post Connor Baldwin osteotomy with screw left he states that is improving the pain is declining and I still have some minimal swelling.  He denies fever chills nausea vomiting muscle aches pains calf pain back pain chest pain shortness of breath.  Objective: Vital signs are stable alert and oriented x3.  There is mild edema no erythema cellulitis drainage or odor he has great range of motion of the first metatarsophalangeal joint of the left foot.  Radiographs taken today demonstrate capital osteotomy is intact internal fixation is intact without loosening.  Assessment: Well-healing surgical foot.  Plan: I will allow him back into a regular tennis shoe instructed him on not overdoing it he understands this and is amenable to it I will follow-up with him in about 1 month.  X-rays will be taken at that point he will call sooner with questions or concerns.

## 2019-06-13 ENCOUNTER — Encounter: Payer: Medicare Other | Admitting: Podiatry

## 2019-06-15 ENCOUNTER — Other Ambulatory Visit: Payer: Self-pay

## 2019-06-15 ENCOUNTER — Ambulatory Visit (INDEPENDENT_AMBULATORY_CARE_PROVIDER_SITE_OTHER): Payer: Medicare Other | Admitting: Podiatry

## 2019-06-15 ENCOUNTER — Ambulatory Visit (INDEPENDENT_AMBULATORY_CARE_PROVIDER_SITE_OTHER): Payer: Medicare Other

## 2019-06-15 VITALS — Temp 96.2°F

## 2019-06-15 DIAGNOSIS — M2012 Hallux valgus (acquired), left foot: Secondary | ICD-10-CM

## 2019-06-15 NOTE — Progress Notes (Signed)
  Subjective:  Patient ID: Connor Baldwin, male    DOB: 1960-07-10,  MRN: [id number]  Chief Complaint  Patient presents with  . Post-op Follow-up    Pov#4 dos 02.19.2021 [place] Bunionectomy Lt. Pt stated, "I've been doing well. Not really having pain - just stiffness".    DOS: 05/05/19 Procedure: [place] Bunionectomy left  59 y.o. male presents with the above complaint. History confirmed with patient.   Objective:  Physical Exam: no tenderness at the surgical site, no edema noted and decreased joint ROM. Incision: fully healed  No images are attached to the encounter.  Radiographs: X-ray of the left foot: osteotomy healed, good position   Assessment:   1. Hav (hallux abducto valgus), left     Plan:  Patient was evaluated and treated and all questions answered.  Post-operative State -XR reviewed with patient -Work on ROM exercises  -Continue WBAT in normal shoegear -F/u in 1 month for final check  No follow-ups on file.

## 2019-07-18 ENCOUNTER — Encounter: Payer: Medicare Other | Admitting: Podiatry

## 2019-07-18 ENCOUNTER — Ambulatory Visit: Payer: Medicare Other

## 2019-07-20 ENCOUNTER — Other Ambulatory Visit: Payer: Self-pay

## 2019-07-20 ENCOUNTER — Ambulatory Visit (INDEPENDENT_AMBULATORY_CARE_PROVIDER_SITE_OTHER): Payer: Medicare Other

## 2019-07-20 ENCOUNTER — Ambulatory Visit (INDEPENDENT_AMBULATORY_CARE_PROVIDER_SITE_OTHER): Payer: Medicare Other | Admitting: Podiatry

## 2019-07-20 DIAGNOSIS — M2012 Hallux valgus (acquired), left foot: Secondary | ICD-10-CM

## 2019-07-20 DIAGNOSIS — Z9889 Other specified postprocedural states: Secondary | ICD-10-CM

## 2019-07-20 NOTE — Progress Notes (Signed)
He presents today date of surgery May 05, 2019 great range of motion he says is very happy no pain.  Objective: Vital signs are stable he is alert oriented x3.  Has great range of motion minimal edema to the first and metatarsal space no pain on palpation of the incision site.  Radiographs taken today demonstrate internal fixation first metatarsal is intact and appears to be remodeling.  Assessment: Well-healing surgical toe and foot.  Plan: Follow-up with me on an as-needed basis allow him to get back to his regular activity level.

## 2019-08-07 NOTE — Progress Notes (Signed)
This encounter was created in error - please disregard.

## 2021-02-18 ENCOUNTER — Other Ambulatory Visit: Payer: Self-pay

## 2021-02-18 ENCOUNTER — Telehealth (HOSPITAL_COMMUNITY): Payer: Self-pay

## 2021-02-18 ENCOUNTER — Ambulatory Visit (INDEPENDENT_AMBULATORY_CARE_PROVIDER_SITE_OTHER): Payer: Medicare Other

## 2021-02-18 ENCOUNTER — Ambulatory Visit (HOSPITAL_COMMUNITY)
Admission: EM | Admit: 2021-02-18 | Discharge: 2021-02-18 | Disposition: A | Discharge status: Home / Self Care | Payer: Medicare Other | Attending: Urgent Care | Admitting: Urgent Care

## 2021-02-18 ENCOUNTER — Encounter (HOSPITAL_COMMUNITY): Payer: Self-pay | Admitting: Emergency Medicine

## 2021-02-18 DIAGNOSIS — I10 Essential (primary) hypertension: Secondary | ICD-10-CM | POA: Insufficient documentation

## 2021-02-18 DIAGNOSIS — R059 Cough, unspecified: Secondary | ICD-10-CM

## 2021-02-18 DIAGNOSIS — R042 Hemoptysis: Secondary | ICD-10-CM | POA: Diagnosis not present

## 2021-02-18 DIAGNOSIS — Z20822 Contact with and (suspected) exposure to covid-19: Secondary | ICD-10-CM | POA: Insufficient documentation

## 2021-02-18 DIAGNOSIS — R051 Acute cough: Secondary | ICD-10-CM | POA: Diagnosis not present

## 2021-02-18 DIAGNOSIS — D869 Sarcoidosis, unspecified: Secondary | ICD-10-CM | POA: Diagnosis not present

## 2021-02-18 DIAGNOSIS — B349 Viral infection, unspecified: Secondary | ICD-10-CM | POA: Diagnosis not present

## 2021-02-18 DIAGNOSIS — R0789 Other chest pain: Secondary | ICD-10-CM | POA: Insufficient documentation

## 2021-02-18 HISTORY — DX: Sarcoidosis, unspecified: D86.9

## 2021-02-18 LAB — RESPIRATORY PANEL BY PCR

## 2021-02-18 MED ORDER — CETIRIZINE HCL 10 MG PO TABS: 10.0000 mg | ORAL_TABLET | Freq: Every day | ORAL | 0 refills | Status: AC

## 2021-02-18 MED ORDER — PROMETHAZINE-DM 6.25-15 MG/5ML PO SYRP: 5.0000 mL | ORAL_SOLUTION | Freq: Every evening | ORAL | 0 refills | Status: AC | PRN

## 2021-02-18 MED ORDER — PSEUDOEPHEDRINE HCL 30 MG PO TABS: 30.0000 mg | ORAL_TABLET | Freq: Three times a day (TID) | ORAL | 0 refills | Status: AC | PRN

## 2021-02-18 MED ORDER — BENZONATATE 100 MG PO CAPS: 100.0000 mg | ORAL_CAPSULE | Freq: Three times a day (TID) | ORAL | 0 refills | Status: AC | PRN

## 2021-02-18 NOTE — ED Provider Notes (Signed)
[facility]   MRN: [id number] DOB: Nov 14, 1960  Subjective:   Connor Baldwin is a 60 y.o. male presenting for 5 to 6-day history of acute onset persistent coughing with chest congestion, chest tightness.  Has also had some slight congestion and drainage.  Became concerned when he noticed that he was coughing up some blood this morning.  States that there was blood-tinged sputum but not blood clots.  He does have a history of sarcoidosis.  States that currently it is being monitored and is not an issue but wanted to be evaluated.  Would like COVID and flu testing.  Also has concerns about his blood pressure but does not want to start any medications today.  Has never been on them.  He does have an appointment with his regular doctor tomorrow.  No current facility-administered medications for this encounter.  Current Outpatient Medications:    albuterol (PROVENTIL HFA;VENTOLIN HFA) 108 (90 Base) MCG/ACT inhaler, Inhale into the lungs., Disp: , Rfl:    ergocalciferol (VITAMIN D2) 1.25 MG (50000 UT) capsule, Take by mouth., Disp: , Rfl:    gabapentin (NEURONTIN) 800 MG tablet, Take by mouth., Disp: , Rfl:    hydrOXYzine (ATARAX/VISTARIL) 25 MG tablet, , Disp: , Rfl:    ketoconazole (NIZORAL) 2 % shampoo, Apply topically., Disp: , Rfl:    lidocaine (XYLOCAINE) 2 % jelly, Apply 1 application topically as needed., Disp: 30 mL, Rfl: 0   loratadine (CLARITIN) 10 MG tablet, Take by mouth., Disp: , Rfl:    Melatonin 3 MG TABS, Take by mouth., Disp: , Rfl:    meloxicam (MOBIC) 15 MG tablet, Take 1 tablet (15 mg total) daily by mouth., Disp: 30 tablet, Rfl: 3   Multiple Vitamin (MULTIVITAMIN) capsule, Take by mouth., Disp: , Rfl:    Nitroglycerin 0.4 % OINT, Place 1 applicator rectally in the morning and at bedtime., Disp: 30 g, Rfl: 0   ondansetron (ZOFRAN) 4 MG tablet, Take 1 tablet (4 mg total) by mouth every 8 (eight) hours as needed., Disp: 20 tablet, Rfl: 0   pantoprazole  (PROTONIX) 40 MG tablet, Take 40 mg by mouth., Disp: , Rfl:    PARoxetine (PAXIL) 40 MG tablet, Take by mouth., Disp: , Rfl:    polyethylene glycol powder (MIRALAX) 17 GM/SCOOP powder, Consume 2 caps daily until you have a bowel movement, then 1 cap daily adjusted to stool consistency, Disp: 507 g, Rfl: 0   potassium chloride (KLOR-CON) 20 MEQ packet, Take by mouth., Disp: , Rfl:    promethazine (PHENERGAN) 25 MG tablet, Take 1 tablet (25 mg total) by mouth every 8 (eight) hours as needed., Disp: 20 tablet, Rfl: 0   QUEtiapine (SEROQUEL) 200 MG tablet, Take by mouth., Disp: , Rfl:    senna-docusate (SENOKOT-S) 8.6-50 MG tablet, Take by mouth., Disp: , Rfl:    SUMAtriptan (IMITREX) 100 MG tablet, Take by mouth., Disp: , Rfl:    terazosin (HYTRIN) 2 MG capsule, , Disp: , Rfl:    timolol (TIMOPTIC) 0.5 % ophthalmic solution, 1 drop Two (2) times a day., Disp: , Rfl:    tolnaftate (TINACTIN) 1 % powder, Apply topically., Disp: , Rfl:    zolmitriptan (ZOMIG) 5 MG tablet, Take 5 mg by mouth as needed for migraine., Disp: , Rfl:    zolpidem (AMBIEN) 10 MG tablet, Take 10 mg by mouth., Disp: , Rfl:    Allergies  Allergen Reactions   Sulfa Antibiotics Itching    And can't sleep  Past Medical History:  Diagnosis Date   Sarcoidosis      Past Surgical History:  Procedure Laterality Date   BACK SURGERY      Family History  Problem Relation Age of Onset   Hypertension Mother    Hypertension Father    Cancer Father     Social History   Tobacco Use   Smoking status: Never   Smokeless tobacco: Never  Substance Use Topics   Alcohol use: Yes    Comment: 2 drinks q 2 weeks   Drug use: No    ROS   Objective:   Vitals: BP (!) 145/90 (BP Location: Left Arm)   Pulse 74   Temp 98.5 F (36.9 C) (Oral)   Resp 16   SpO2 96%   BP Readings from Last 3 Encounters:  02/18/21 (!) 145/90  05/12/19 (!) 156/99  11/26/16 (!) 163/90   Physical Exam Constitutional:      General: He is  not in acute distress.    Appearance: Normal appearance. He is well-developed. He is not ill-appearing, toxic-appearing or diaphoretic.  HENT:     Head: Normocephalic and atraumatic.     Right Ear: External ear normal.     Left Ear: External ear normal.     Nose: Nose normal.     Mouth/Throat:     Mouth: Mucous membranes are moist.     Pharynx: No oropharyngeal exudate or posterior oropharyngeal erythema.  Eyes:     General: No scleral icterus.    Extraocular Movements: Extraocular movements intact.     Pupils: Pupils are equal, round, and reactive to light.  Cardiovascular:     Rate and Rhythm: Normal rate and regular rhythm.     Heart sounds: Normal heart sounds. No murmur heard.   No friction rub. No gallop.  Pulmonary:     Effort: Pulmonary effort is normal. No respiratory distress.     Breath sounds: Normal breath sounds. No stridor. No wheezing, rhonchi or rales.  Neurological:     Mental Status: He is alert and oriented to person, place, and time.  Psychiatric:        Mood and Affect: Mood normal.        Behavior: Behavior normal.        Thought Content: Thought content normal.   DG Chest 2 View  Result Date: 02/18/2021 CLINICAL DATA:  Cough, hemoptysis EXAM: CHEST - 2 VIEW COMPARISON:  None. FINDINGS: The heart size and mediastinal contours are within normal limits. No acute airspace opacity. Nodular retrocardiac opacity, which overlaps with the left fifth rib end, measuring 2.1 cm in projection. The visualized skeletal structures are unremarkable. IMPRESSION: 1.  No acute abnormality of the lungs. 2. Nodular retrocardiac opacity, which overlaps with the left fifth rib end, measuring 2.1 cm in projection. Suspect that this is a calcified rib end, however consider CT to further evaluate given otherwise unexplained hemoptysis. Electronically Signed   By: Delanna Ahmadi M.D.   On: 02/18/2021 12:36     Assessment and Plan :   PDMP not reviewed this encounter.  1. Acute viral  syndrome   2. Acute cough   3. Hemoptysis   4. Chest tightness   5. Sarcoidosis   6. Essential hypertension    COVID and flu test pending.  We will otherwise manage for viral upper respiratory infection.  Physical exam findings reassuring and vital signs stable for discharge. Advised supportive care, offered symptomatic relief.  Discussed incidental findings on x-ray and patient  actually has a follow-up with the [facility] already to obtain a chest CT scan.  Discussed possibility of needing a blood pressure medication but he prefers to review this with his PCP.  Counseled patient on potential for adverse effects with medications prescribed/recommended today, ER and return-to-clinic precautions discussed, patient verbalized understanding.       Jaynee Eagles, [place] 02/18/21 [phone]

## 2021-02-18 NOTE — Discharge Instructions (Addendum)
We will notify you of your test results as they arrive and may take between 48-72 hours.  I encourage you to sign up for MyChart if you have not already done so as this can be the easiest way for us to communicate results to you online or through a phone app.  Generally, we only contact you if it is a positive test result.  In the meantime, if you develop worsening symptoms including fever, chest pain, shortness of breath despite our current treatment plan then please report to the emergency room as this may be a sign of worsening status from possible viral infection.  Otherwise, we will manage this as a viral syndrome. For sore throat or cough try using a honey-based tea. Use 3 teaspoons of honey with juice squeezed from half lemon. Place shaved pieces of ginger into 1/2-1 cup of water and warm over stove top. Then mix the ingredients and repeat every 4 hours as needed. Please take Tylenol 500mg-650mg every 6 hours for aches and pains, fevers. Hydrate very well with at least 2 liters of water. Eat light meals such as soups to replenish electrolytes and soft fruits, veggies. Start an antihistamine like Zyrtec for postnasal drainage, sinus congestion.  You can take this together with pseudoephedrine (Sudafed) at a dose of 30 mg 2-3 times a day as needed for the same kind of congestion.    

## 2021-02-18 NOTE — Telephone Encounter (Signed)
Returned pt call, pt pick up medications.

## 2021-02-18 NOTE — ED Triage Notes (Signed)
Pt presents with cough xs 5-6 days. States coughing up blood first thing in the morning.   Also c/o with bp.

## 2021-02-19 LAB — SARS CORONAVIRUS 2 (TAT 6-24 HRS): SARS Coronavirus 2: NEGATIVE

## 2021-08-13 ENCOUNTER — Ambulatory Visit
Admission: EM | Admit: 2021-08-13 | Discharge: 2021-08-13 | Disposition: A | Discharge status: Home / Self Care | Payer: Medicare Other | Attending: Urgent Care | Admitting: Urgent Care

## 2021-08-13 DIAGNOSIS — J069 Acute upper respiratory infection, unspecified: Secondary | ICD-10-CM | POA: Diagnosis not present

## 2021-08-13 NOTE — ED Provider Notes (Signed)
[facility]    CSN: [id number] Arrival date & time: 08/13/21  1629      History   Chief Complaint Chief Complaint  Patient presents with   Cough    HPI Connor Baldwin is a 61 y.o. male.   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  He was recently on a [place] cruise, and states that everyone on the cruise ship seems to have similar symptoms.  Many people in his cabin are also sick with similar symptoms.  A COVID test had been performed at home which was negative.  Symptoms started 2 to 3 days ago.  He denies sinus pain ear pain, nausea, vomiting, diarrhea, rash.  Highest temp was 101.  Has been taking TheraFlu and Mucinex without resolution to symptoms.   Cough  Past Medical History:  Diagnosis Date   Sarcoidosis     Patient Active Problem List   Diagnosis Date Noted   Hav (hallux abducto valgus), left 05/29/2019    Past Surgical History:  Procedure Laterality Date   BACK SURGERY         Home Medications    Prior to Admission medications   Medication Sig Start Date End Date Taking? Authorizing Provider  albuterol (PROVENTIL HFA;VENTOLIN HFA) 108 (90 Base) MCG/ACT inhaler Inhale into the lungs.    [provider]  benzonatate (TESSALON) 100 MG capsule Take 1-2 capsules (100-200 mg total) by mouth 3 (three) times daily as needed for cough. 02/18/21   Wallis Bamberg, PA-C  cetirizine (ZYRTEC ALLERGY) 10 MG tablet Take 1 tablet (10 mg total) by mouth daily. 02/18/21   Wallis Bamberg, PA-C  ergocalciferol (VITAMIN D2) 1.25 MG (50000 UT) capsule Take by mouth.    [provider]  gabapentin (NEURONTIN) 800 MG tablet Take by mouth.    [provider]  hydrOXYzine (ATARAX/VISTARIL) 25 MG tablet  11/06/15   [provider]  ketoconazole (NIZORAL) 2 % shampoo Apply topically.    [provider]  lidocaine (XYLOCAINE) 2 % jelly Apply 1 application topically as needed. 05/12/19   Darr, Gerilyn Pilgrim, PA-C  loratadine (CLARITIN) 10 MG tablet Take by mouth.    [provider]  Melatonin 3 MG TABS Take by mouth.    [provider]  meloxicam (MOBIC) 15 MG tablet Take 1 tablet (15 mg total) daily by mouth. 01/21/17   Hyatt, Max T, DPM  Multiple Vitamin (MULTIVITAMIN) capsule Take by mouth.    [provider]  Nitroglycerin 0.4 % OINT Place 1 applicator rectally in the morning and at bedtime. 05/12/19   Darr, Gerilyn Pilgrim, PA-C  ondansetron (ZOFRAN) 4 MG tablet Take 1 tablet (4 mg total) by mouth every 8 (eight) hours as needed. 05/03/19   Hyatt, Max T, DPM  pantoprazole (PROTONIX) 40 MG tablet Take 40 mg by mouth.    [provider]  PARoxetine (PAXIL) 40 MG tablet Take by mouth.    [provider]  polyethylene glycol powder (MIRALAX) 17 GM/SCOOP powder Consume 2 caps daily until you have a bowel movement, then 1 cap daily adjusted to stool consistency 05/12/19   Darr, Gerilyn Pilgrim, PA-C  potassium chloride (KLOR-CON) 20 MEQ packet Take by mouth.    [provider]  promethazine (PHENERGAN) 25 MG tablet Take 1 tablet (25  mg total) by mouth every 8 (eight) hours as needed. 11/19/16   Hyatt, Max T, DPM  promethazine-dextromethorphan (PROMETHAZINE-DM) 6.25-15 MG/5ML syrup Take 5 mLs by mouth at bedtime as needed for cough. 02/18/21   Wallis Bamberg, PA-C  pseudoephedrine (SUDAFED) 30 MG tablet Take 1 tablet (30 mg total) by mouth every 8 (eight) hours as needed for congestion. 02/18/21   Wallis Bamberg, PA-C  QUEtiapine (SEROQUEL) 200 MG tablet Take by mouth.    [provider]  senna-docusate (SENOKOT-S) 8.6-50 MG tablet Take by mouth.    [provider]  SUMAtriptan (IMITREX) 100 MG tablet Take by mouth.    [provider]  terazosin (HYTRIN) 2 MG capsule   08/05/16   [provider]  timolol (TIMOPTIC) 0.5 % ophthalmic solution 1 drop Two (2) times a day.    [provider]  tolnaftate (TINACTIN) 1 % powder Apply topically.    [provider]  zolmitriptan (ZOMIG) 5 MG tablet Take 5 mg by mouth as needed for migraine.    [provider]  zolpidem (AMBIEN) 10 MG tablet Take 10 mg by mouth.    [provider]    Family History Family History  Problem Relation Age of Onset   Hypertension Mother    Hypertension Father    Cancer Father     Social History Social History   Tobacco Use   Smoking status: Never   Smokeless tobacco: Never  Substance Use Topics   Alcohol use: Yes    Comment: 2 drinks q 2 weeks   Drug use: No     Allergies   Sulfa antibiotics   Review of Systems Review of Systems  Respiratory:  Positive for cough.   As per hpi  Physical Exam Triage Vital Signs ED Triage Vitals  Enc Vitals Group     BP 08/13/21 1751 (!) 139/94     Pulse Rate 08/13/21 1751 95     Resp 08/13/21 1751 18     Temp 08/13/21 1751 99.4 F (37.4 C)     Temp Source 08/13/21 1751 Oral     SpO2 08/13/21 1751 94 %     Weight --      Height --      Head Circumference --      Peak Flow --      Pain Score 08/13/21 1753 7     Pain Loc --      Pain Edu? --      Excl. in GC? --    No data found.  Updated Vital Signs BP (!) 139/94 (BP Location: Left Arm)   Pulse 95   Temp 99.4 F (37.4 C) (Oral)   Resp 18   SpO2 94%   Visual Acuity Right Eye Distance:   Left Eye Distance:   Bilateral Distance:    Right Eye Near:   Left Eye Near:    Bilateral Near:     Physical Exam Vitals and nursing note reviewed.  Constitutional:      Appearance: Normal appearance. He is well-developed and normal weight. He is not ill-appearing, toxic-appearing or diaphoretic.  HENT:     Head: Normocephalic and atraumatic.     Right Ear: Tympanic membrane, ear canal and external ear normal. There is no  impacted cerumen.     Left Ear: Tympanic membrane, ear canal and external ear normal. There is no impacted cerumen.     Nose: Nose normal. No congestion or rhinorrhea.     Mouth/Throat:  Mouth: Mucous membranes are moist.     Pharynx: Oropharynx is clear. No oropharyngeal exudate or posterior oropharyngeal erythema.  Eyes:     General: No scleral icterus.       Right eye: No discharge.        Left eye: No discharge.     Extraocular Movements: Extraocular movements intact.     Conjunctiva/sclera: Conjunctivae normal.     Pupils: Pupils are equal, round, and reactive to light.  Cardiovascular:     Rate and Rhythm: Normal rate and regular rhythm.     Pulses: Normal pulses.     Heart sounds: No murmur heard. Pulmonary:     Effort: Pulmonary effort is normal. No respiratory distress.     Breath sounds: No stridor. Wheezing (scant, minimal posterior) present. No rhonchi or rales.  Chest:     Chest wall: No tenderness.  Abdominal:     General: Abdomen is flat. Bowel sounds are normal. There is no distension.     Palpations: Abdomen is soft.     Tenderness: There is no abdominal tenderness.  Musculoskeletal:        General: No swelling.     Cervical back: Normal range of motion and neck supple. No rigidity or tenderness.  Lymphadenopathy:     Cervical: No cervical adenopathy.  Skin:    General: Skin is warm and dry.     Capillary Refill: Capillary refill takes less than 2 seconds.  Neurological:     General: No focal deficit present.     Mental Status: He is alert and oriented to person, place, and time.  Psychiatric:        Mood and Affect: Mood normal.     UC Treatments / Results  Labs (all labs ordered are listed, but only abnormal results are displayed) Labs Reviewed  COVID-19, FLU A+B AND RSV    EKG   Radiology No results found.  Procedures Procedures (including critical care time)  Medications Ordered in UC Medications - No data to display  Initial  Impression / Assessment and Plan / UC Course  I have reviewed the triage vital signs and the nursing notes.  Pertinent labs & imaging results that were available during my care of the patient were reviewed by me and considered in my medical decision making (see chart for details).     Viral URI - covid, flu, RSV testing performed today. Appears viral, supportive measures discussed. Given hx of pulmonary sarcoid, pt would be a candidate for covid therapy if positive test results. Strict RTC precautions reviewed.   Final Clinical Impressions(s) / UC Diagnoses   Final diagnoses:  Viral upper respiratory infection     Discharge Instructions      You were tested today for covid, flu, rsv. We will call when we have the results. Possible treatment options include paxlovid or Molnupiravir, investigational new drugs approved for Emergency use authorization only, and dexamethasone, a high potency corticosteroid. Both of these medications are reserved and recommended for individuals with significant comorbidities or risk factors. For those who are otherwise young and healthy, supportive measures with rest and hydration may be adequate. Please monitor regression of your symptoms. Some people have tried OTC Quercetin to help fight off illness. If any new or worsening symptoms develops, particularly uncontrollable fever, severe shortness of breath or chest pain, please head to the ER.      ED Prescriptions   None    PDMP not reviewed this encounter.   Russel Morain L,  PA 08/13/21 2117

## 2021-08-13 NOTE — Discharge Instructions (Addendum)
You were tested today for covid, flu, rsv. We will call when we have the results. Possible treatment options include paxlovid or Molnupiravir, investigational new drugs approved for Emergency use authorization only, and dexamethasone, a high potency corticosteroid. Both of these medications are reserved and recommended for individuals with significant comorbidities or risk factors. For those who are otherwise young and healthy, supportive measures with rest and hydration may be adequate. Please monitor regression of your symptoms. Some people have tried OTC Quercetin to help fight off illness. If any new or worsening symptoms develops, particularly uncontrollable fever, severe shortness of breath or chest pain, please head to the ER.

## 2021-08-13 NOTE — ED Triage Notes (Signed)
Pt present coughing with chills and fever. Symptoms started on Monday. Pt states having body aches and tried otc medication with no relief.

## 2021-08-15 LAB — COVID-19, FLU A+B AND RSV
Influenza A, NAA: NOT DETECTED
Influenza B, NAA: NOT DETECTED
RSV, NAA: NOT DETECTED
SARS-CoV-2, NAA: NOT DETECTED

## 2021-08-22 IMAGING — DX DG ABDOMEN 1V
1 series · 1 of 1 positions shown · non-contrast
Comparison: None.

CLINICAL DATA: Severe constipation

EXAM:
ABDOMEN - 1 VIEW

[abdomen kub]
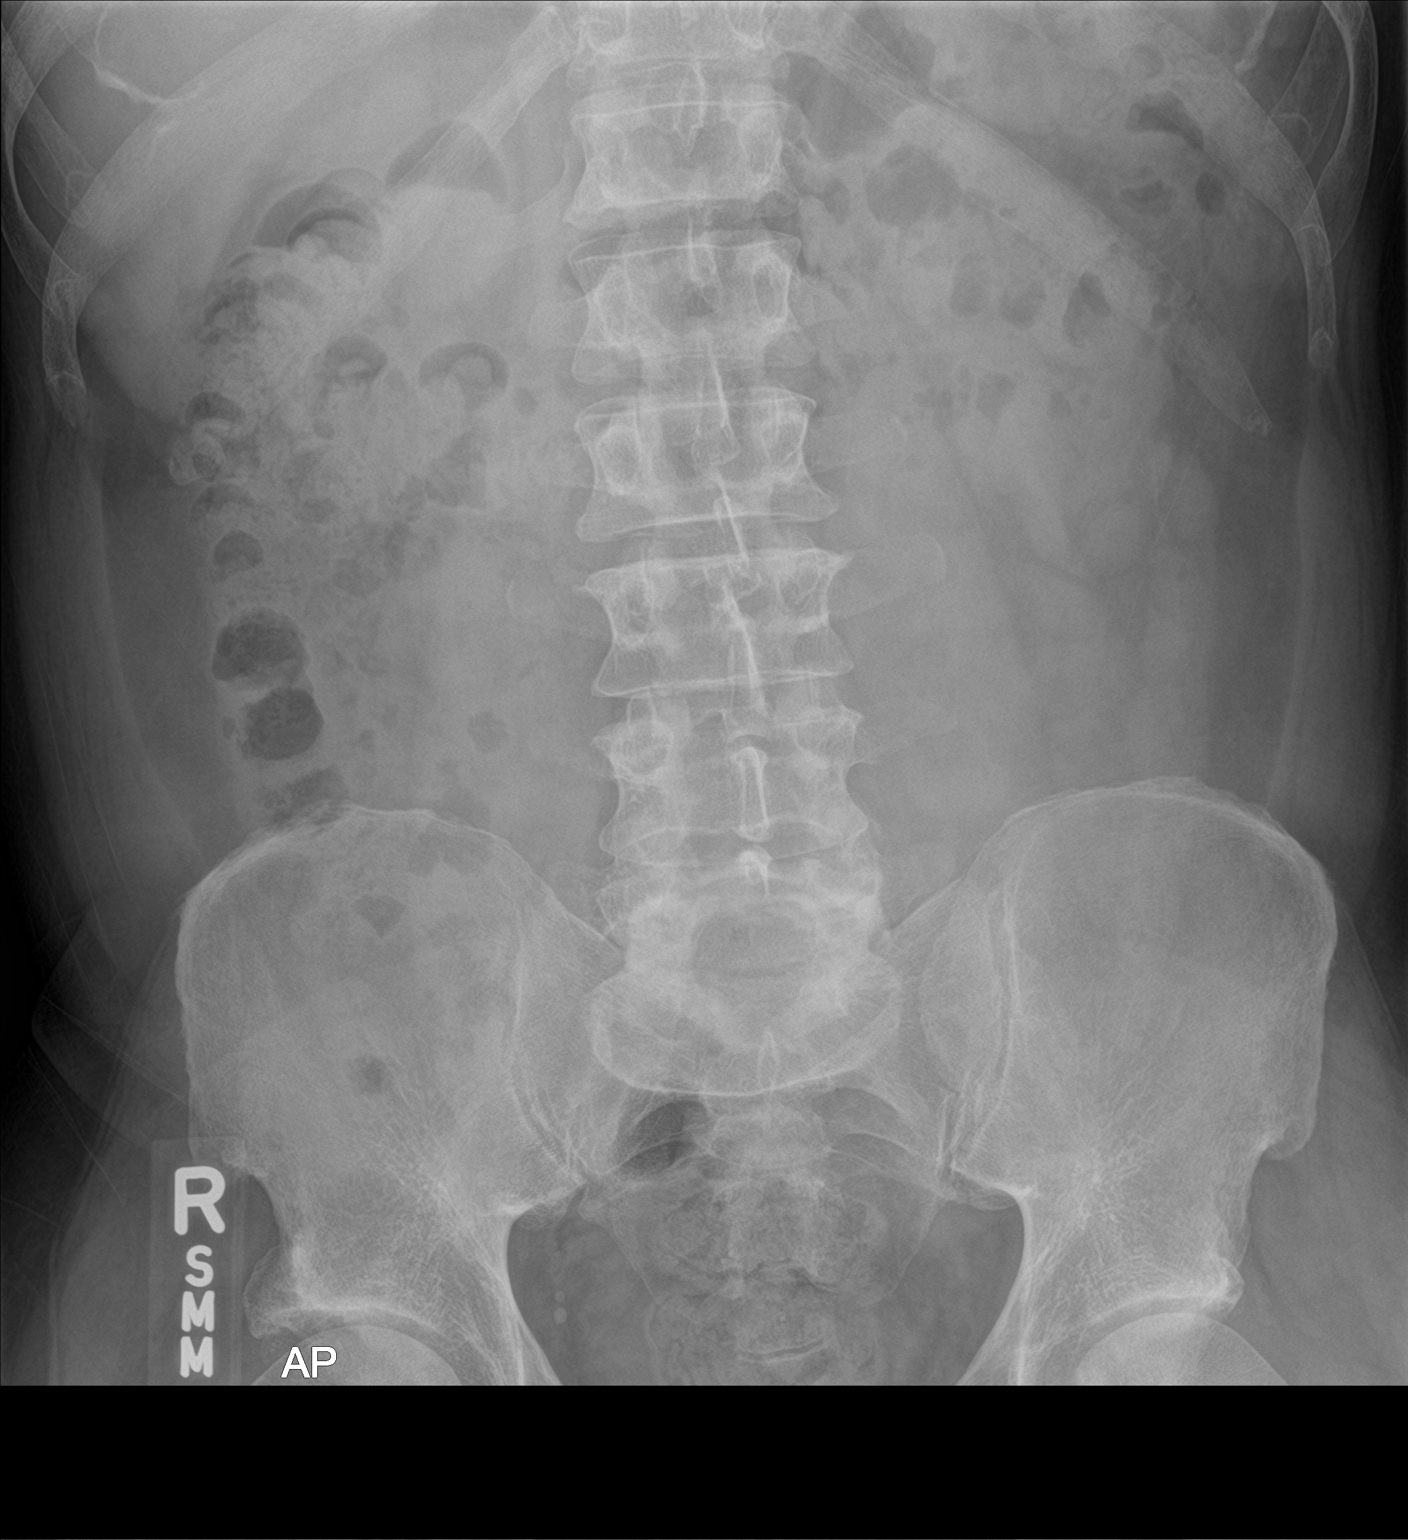

[1 of 1 positions shown; findings below may reference images not displayed]

FINDINGS: There is an above average amount of stool throughout the colon. The
bowel gas pattern is nonobstructive. Calcifications project over the
patient's right hemipelvis and are favored to represent phleboliths.
There is no acute osseous abnormality. Degenerative changes are
noted of the hips and spine.
IMPRESSION: Above average amount of stool throughout the colon.

## 2023-02-08 ENCOUNTER — Ambulatory Visit (HOSPITAL_COMMUNITY)
Admission: EM | Admit: 2023-02-08 | Discharge: 2023-02-08 | Disposition: A | Discharge status: Home / Self Care | Payer: No Typology Code available for payment source

## 2023-02-08 ENCOUNTER — Encounter (HOSPITAL_COMMUNITY): Payer: Self-pay | Admitting: *Deleted

## 2023-02-08 ENCOUNTER — Other Ambulatory Visit: Payer: Self-pay

## 2023-02-08 ENCOUNTER — Ambulatory Visit (INDEPENDENT_AMBULATORY_CARE_PROVIDER_SITE_OTHER): Payer: No Typology Code available for payment source

## 2023-02-08 DIAGNOSIS — R053 Chronic cough: Secondary | ICD-10-CM

## 2023-02-08 MED ORDER — PREDNISONE 20 MG PO TABS: 40.0000 mg | ORAL_TABLET | Freq: Every day | ORAL | 0 refills | Status: AC

## 2023-02-08 NOTE — ED Provider Notes (Signed)
[facility]    CSN: [id number] Arrival date & time: 02/08/23  1136      History   Chief Complaint Chief Complaint  Patient presents with   Cough   Muscle Pain    HPI Connor Baldwin is a 62 y.o. male.   Patient presents with cough that has been present for about 2 months.  Patient states that he had cold-like symptoms that left him with a persistent cough.  He saw his PCP at the [place] about 3 weeks after symptoms started.  He was prescribed Tessalon Perles with minimal improvement.  Reports that over the past 2 weeks he started having some chest discomfort that occurs only when coughing which he became concerned about.  Denies any recent fever.  He does report pertinent medical history of left lower lobectomy given history of lung cancer and he also reports a history of sarcoidosis.  Reports that he uses albuterol inhaler as needed for this which he has used occasionally for intermittent shortness of breath with improvement.   Cough Muscle Pain    Past Medical History:  Diagnosis Date   Sarcoidosis     Patient Active Problem List   Diagnosis Date Noted   Hav (hallux abducto valgus), left 05/29/2019    Past Surgical History:  Procedure Laterality Date   BACK SURGERY         Home Medications    Prior to Admission medications   Medication Sig Start Date End Date Taking? Authorizing Provider  albuterol (PROVENTIL HFA;VENTOLIN HFA) 108 (90 Base) MCG/ACT inhaler Inhale into the lungs.   Yes [provider]  ergocalciferol (VITAMIN D2) 1.25 MG (50000 UT) capsule Take by mouth.   Yes [provider]  gabapentin (NEURONTIN) 800 MG tablet Take by mouth.   Yes [provider]  ketoconazole (NIZORAL) 2 % shampoo Apply topically.   Yes [provider]  loratadine (CLARITIN) 10 MG tablet Take by mouth.   Yes [provider]  Melatonin 3 MG TABS Take by mouth.   Yes [provider]  Multiple Vitamin (MULTIVITAMIN)  capsule Take by mouth.   Yes [provider]  oxycodone (OXY-IR) 5 MG capsule Take 5 mg by mouth every 4 (four) hours as needed for pain.   Yes [provider]  pantoprazole (PROTONIX) 40 MG tablet Take 40 mg by mouth.   Yes [provider]  potassium chloride (KLOR-CON) 20 MEQ packet Take by mouth.   Yes [provider]  predniSONE (DELTASONE) 20 MG tablet Take 2 tablets (40 mg total) by mouth daily for 5 days. 02/08/23 02/13/23 Yes Shantelle Alles, Acie Fredrickson, FNP  QUEtiapine (SEROQUEL) 200 MG tablet Take by mouth.   Yes [provider]  timolol (TIMOPTIC) 0.5 % ophthalmic solution 1 drop Two (2) times a day.   Yes [provider]  tolnaftate (TINACTIN) 1 % powder Apply topically.   Yes [provider]  zolmitriptan (ZOMIG) 5 MG tablet Take 5 mg by mouth as needed for migraine.   Yes [provider]  zolpidem (AMBIEN) 10 MG tablet Take 10 mg by mouth.   Yes [provider]  benzonatate (TESSALON) 100 MG capsule Take 1-2 capsules (100-200 mg total) by mouth 3 (three) times daily as needed for cough. 02/18/21   Wallis Bamberg, PA-C  cetirizine (ZYRTEC ALLERGY) 10 MG tablet Take 1 tablet (10 mg total) by mouth daily. 02/18/21   Wallis Bamberg, PA-C  hydrOXYzine (ATARAX/VISTARIL) 25 MG tablet  11/06/15   [provider]  lidocaine (XYLOCAINE) 2 % jelly Apply 1 application topically as needed. 05/12/19   Darr, Gerilyn Pilgrim, PA-C  meloxicam (MOBIC) 15 MG tablet Take 1 tablet (15 mg total) daily by mouth. 01/21/17   Hyatt, Max T, DPM  Nitroglycerin 0.4 % OINT Place 1 applicator rectally in the morning and at bedtime. 05/12/19   Darr, Gerilyn Pilgrim, PA-C  ondansetron (ZOFRAN) 4 MG tablet Take 1 tablet (4 mg total) by mouth every 8 (eight) hours as needed. 05/03/19   Hyatt, Max T, DPM  PARoxetine (PAXIL) 40 MG tablet Take by mouth.    [provider]  polyethylene glycol powder (MIRALAX) 17 GM/SCOOP powder Consume 2 caps daily until you have a  bowel movement, then 1 cap daily adjusted to stool consistency 05/12/19   Darr, Gerilyn Pilgrim, PA-C  promethazine (PHENERGAN) 25 MG tablet Take 1 tablet (25 mg total) by mouth every 8 (eight) hours as needed. 11/19/16   Hyatt, Max T, DPM  promethazine-dextromethorphan (PROMETHAZINE-DM) 6.25-15 MG/5ML syrup Take 5 mLs by mouth at bedtime as needed for cough. 02/18/21   Wallis Bamberg, PA-C  pseudoephedrine (SUDAFED) 30 MG tablet Take 1 tablet (30 mg total) by mouth every 8 (eight) hours as needed for congestion. 02/18/21   Wallis Bamberg, PA-C  senna-docusate (SENOKOT-S) 8.6-50 MG tablet Take by mouth.    [provider]  SUMAtriptan (IMITREX) 100 MG tablet Take by mouth.    [provider]  terazosin (HYTRIN) 2 MG capsule  08/05/16   [provider]    Family History Family History  Problem Relation Age of Onset   Hypertension Mother    Hypertension Father    Cancer Father     Social History Social History   Tobacco Use   Smoking status: Never   Smokeless tobacco: Never  Substance Use Topics   Alcohol use: Yes    Comment: 2 drinks q 2 weeks   Drug use: No     Allergies   Cholecalciferol, Sulfamethoxazole-trimethoprim, and Sulfa antibiotics   Review of Systems Review of Systems Per HPI  Physical Exam Triage Vital Signs ED Triage Vitals  Encounter Vitals Group     BP 02/08/23 1314 136/89     Systolic BP Percentile --      Diastolic BP Percentile --      Pulse Rate 02/08/23 1314 65     Resp 02/08/23 1314 18     Temp 02/08/23 1314 97.7 F (36.5 C)     Temp src --      SpO2 02/08/23 1314 97 %     Weight --      Height --      Head Circumference --      Peak Flow --      Pain Score 02/08/23 1304 8     Pain Loc --      Pain Education --      Exclude from Growth Chart --    No data found.  Updated Vital Signs BP 136/89   Pulse 65   Temp 97.7 F (36.5 C)   Resp 18   SpO2 97%   Visual Acuity Right Eye Distance:   Left Eye Distance:   Bilateral  Distance:    Right Eye Near:   Left Eye Near:    Bilateral Near:     Physical Exam Constitutional:      General: He is not in acute distress.    Appearance: Normal appearance. He is not toxic-appearing or diaphoretic.  HENT:  Head: Normocephalic and atraumatic.  Eyes:     Extraocular Movements: Extraocular movements intact.     Conjunctiva/sclera: Conjunctivae normal.  Cardiovascular:     Rate and Rhythm: Normal rate and regular rhythm.     Pulses: Normal pulses.     Heart sounds: Normal heart sounds.  Pulmonary:     Effort: Pulmonary effort is normal. No respiratory distress.     Breath sounds: Normal breath sounds. No stridor. No wheezing, rhonchi or rales.  Chest:     Chest wall: No tenderness.  Neurological:     General: No focal deficit present.     Mental Status: He is alert and oriented to person, place, and time. Mental status is at baseline.  Psychiatric:        Mood and Affect: Mood normal.        Behavior: Behavior normal.        Thought Content: Thought content normal.        Judgment: Judgment normal.      UC Treatments / Results  Labs (all labs ordered are listed, but only abnormal results are displayed) Labs Reviewed - No data to display  EKG   Radiology DG Chest 2 View  Result Date: 02/08/2023 CLINICAL DATA:  Cough for 2 months. EXAM: CHEST - 2 VIEW COMPARISON:  02/18/2021 FINDINGS: The heart size and mediastinal contours are within normal limits. Both lungs are clear. The visualized skeletal structures are unremarkable. IMPRESSION: No active cardiopulmonary disease. Electronically Signed   By: Danae Orleans M.D.   On: 02/08/2023 14:07    Procedures Procedures (including critical care time)  Medications Ordered in UC Medications - No data to display  Initial Impression / Assessment and Plan / UC Course  I have reviewed the triage vital signs and the nursing notes.  Pertinent labs & imaging results that were available during my care of the  patient were reviewed by me and considered in my medical decision making (see chart for details).     Chest x-ray completed that was negative for any acute cardiopulmonary process.  Suspect postviral cough versus viral bronchitis.  There are no indications for need for antibiotic therapy on physical exam.  I do think patient would benefit from steroid burst so will prescribe prednisone today.  Patient does have history of glaucoma but is maintained with eyedrops and I do think benefits outweigh risks to help alleviate coughing.  Encouraged  strict follow-up with PCP, urgent care or, or pulmonologist if symptoms persist or worsen.  Patient verbalized understanding and was agreeable with plan. Final Clinical Impressions(s) / UC Diagnoses   Final diagnoses:  Persistent cough for 3 weeks or longer     Discharge Instructions      I have prescribed you prednisone steroid to decrease inflammation associated with cough.  I will call if x-ray is abnormal.  Please follow-up if any symptoms persist or worsen.     ED Prescriptions     Medication Sig Dispense Auth. Provider   predniSONE (DELTASONE) 20 MG tablet Take 2 tablets (40 mg total) by mouth daily for 5 days. 10 tablet Gustavus Bryant, [place]      PDMP not reviewed this encounter.   Gustavus Bryant, [place] 02/08/23 [phone]

## 2023-02-08 NOTE — ED Triage Notes (Signed)
Pt reports a cough for one month and muscle pain with cough .

## 2023-02-08 NOTE — Discharge Instructions (Addendum)
I have prescribed you prednisone steroid to decrease inflammation associated with cough.  I will call if x-ray is abnormal.  Please follow-up if any symptoms persist or worsen.

## 2023-06-01 IMAGING — DX DG CHEST 2V
2 series · 2 of 2 positions shown · non-contrast
Comparison: None.

CLINICAL DATA: Cough, hemoptysis

EXAM:
CHEST - 2 VIEW

[chest pa]
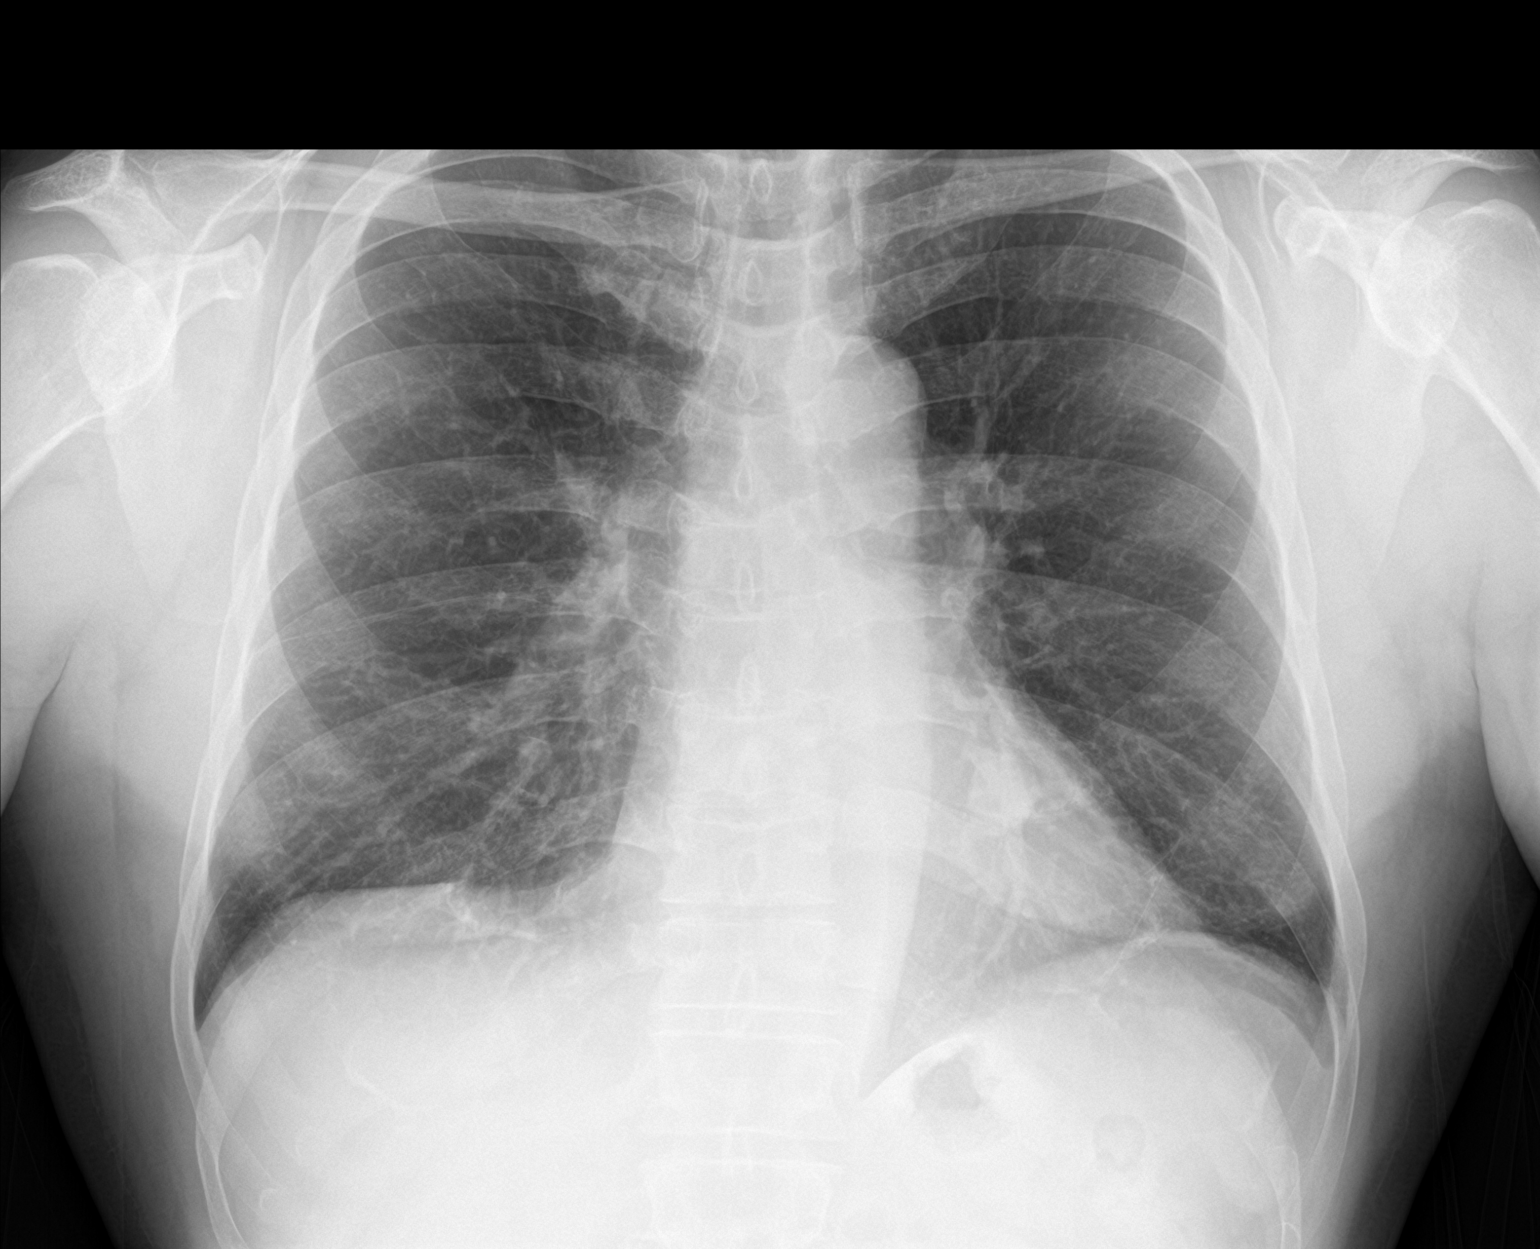

[chest lat]
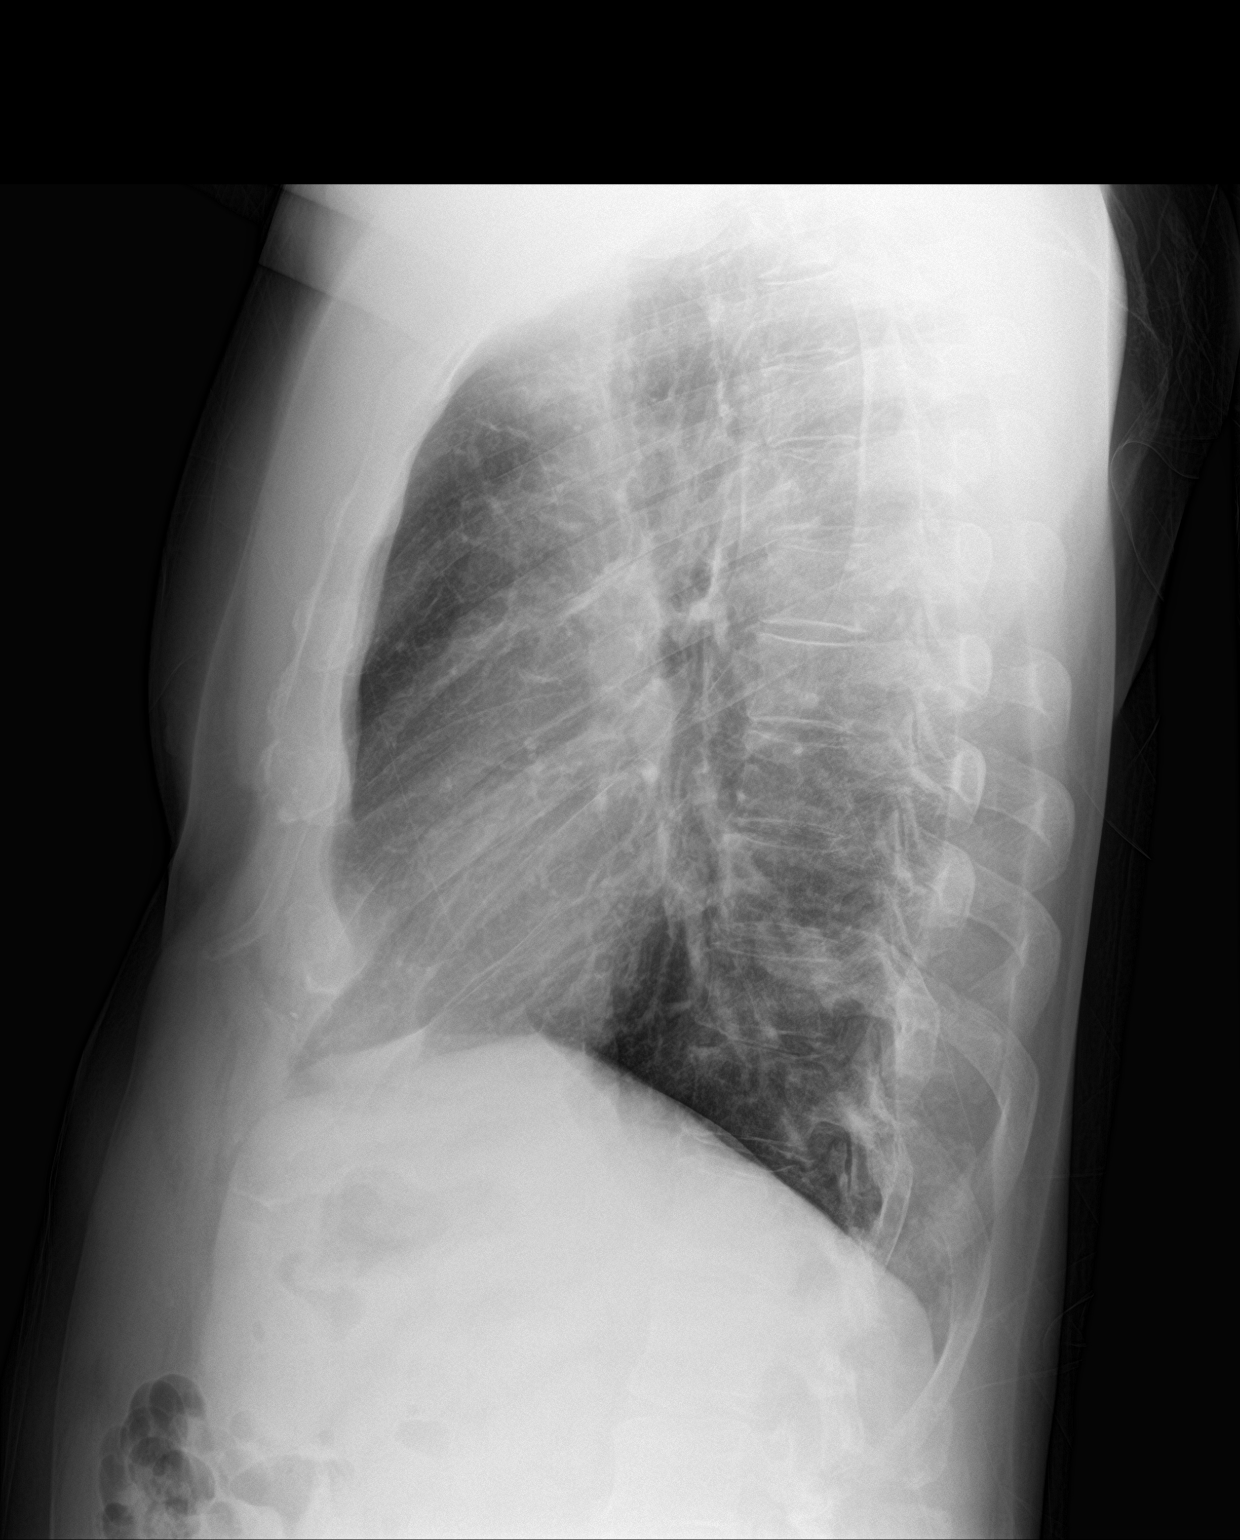

[2 of 2 positions shown; findings below may reference images not displayed]

FINDINGS: The heart size and mediastinal contours are within normal limits. No
acute airspace opacity. Nodular retrocardiac opacity, which overlaps
with the left fifth rib end, measuring 2.1 cm in projection. The
visualized skeletal structures are unremarkable.
IMPRESSION: 1.  No acute abnormality of the lungs.

2. Nodular retrocardiac opacity, which overlaps with the left fifth
rib end, measuring 2.1 cm in projection. Suspect that this is a
calcified rib end, however consider CT to further evaluate given
otherwise unexplained hemoptysis.

## 2024-04-04 ENCOUNTER — Encounter (HOSPITAL_BASED_OUTPATIENT_CLINIC_OR_DEPARTMENT_OTHER): Payer: Self-pay | Admitting: Emergency Medicine

## 2024-04-04 ENCOUNTER — Other Ambulatory Visit: Payer: Self-pay

## 2024-04-04 ENCOUNTER — Emergency Department (HOSPITAL_BASED_OUTPATIENT_CLINIC_OR_DEPARTMENT_OTHER)

## 2024-04-04 ENCOUNTER — Emergency Department (HOSPITAL_BASED_OUTPATIENT_CLINIC_OR_DEPARTMENT_OTHER)
Admission: EM | Admit: 2024-04-04 | Discharge: 2024-04-04 | Disposition: A | Discharge status: Home / Self Care | Attending: Emergency Medicine | Admitting: Emergency Medicine

## 2024-04-04 ENCOUNTER — Emergency Department (HOSPITAL_BASED_OUTPATIENT_CLINIC_OR_DEPARTMENT_OTHER): Admitting: Radiology

## 2024-04-04 DIAGNOSIS — J40 Bronchitis, not specified as acute or chronic: Secondary | ICD-10-CM | POA: Insufficient documentation

## 2024-04-04 DIAGNOSIS — Z79899 Other long term (current) drug therapy: Secondary | ICD-10-CM | POA: Insufficient documentation

## 2024-04-04 LAB — CBC WITH DIFFERENTIAL/PLATELET
Abs Immature Granulocytes: 0.02 K/uL (ref 0.00–0.07)
Basophils Absolute: 0.1 K/uL (ref 0.0–0.1)
Basophils Relative: 1 %
Eosinophils Absolute: 0.5 K/uL (ref 0.0–0.5)
Eosinophils Relative: 7 %
HCT: 43.4 % (ref 39.0–52.0)
Hemoglobin: 14 g/dL (ref 13.0–17.0)
Immature Granulocytes: 0 %
Lymphocytes Relative: 19 %
Lymphs Abs: 1.4 K/uL (ref 0.7–4.0)
MCH: 23 pg — ABNORMAL LOW (ref 26.0–34.0)
MCHC: 32.3 g/dL (ref 30.0–36.0)
MCV: 71.4 fL — ABNORMAL LOW (ref 80.0–100.0)
Monocytes Absolute: 0.4 K/uL (ref 0.1–1.0)
Monocytes Relative: 5 %
Neutro Abs: 5 K/uL (ref 1.7–7.7)
Neutrophils Relative %: 68 %
Platelets: 191 K/uL (ref 150–400)
RBC: 6.08 MIL/uL — ABNORMAL HIGH (ref 4.22–5.81)
RDW: 15.9 % — ABNORMAL HIGH (ref 11.5–15.5)
WBC: 7.3 K/uL (ref 4.0–10.5)
nRBC: 0 % (ref 0.0–0.2)

## 2024-04-04 LAB — BASIC METABOLIC PANEL WITH GFR
Anion gap: 10 (ref 5–15)
BUN: 12 mg/dL (ref 8–23)
CO2: 28 mmol/L (ref 22–32)
Calcium: 9.7 mg/dL (ref 8.9–10.3)
Chloride: 102 mmol/L (ref 98–111)
Creatinine, Ser: 0.95 mg/dL (ref 0.61–1.24)
GFR, Estimated: >60 mL/min
Glucose, Bld: 88 mg/dL (ref 70–99)
Potassium: 3.9 mmol/L (ref 3.5–5.1)
Sodium: 140 mmol/L (ref 135–145)

## 2024-04-04 MED ORDER — IOHEXOL 300 MG/ML  SOLN
75.0000 mL | Freq: Once | INTRAMUSCULAR | Status: AC | PRN
  Administered 2024-04-04: 75 mL via INTRAVENOUS

## 2024-04-04 MED ORDER — PREDNISONE 10 MG (21) PO TBPK: ORAL_TABLET | Freq: Every day | ORAL | 0 refills | Status: AC

## 2024-04-04 MED ORDER — IPRATROPIUM-ALBUTEROL 0.5-2.5 (3) MG/3ML IN SOLN
3.0000 mL | Freq: Once | RESPIRATORY_TRACT | Status: AC
  Administered 2024-04-04: 3 mL via RESPIRATORY_TRACT

## 2024-04-04 MED ORDER — BENZONATATE 100 MG PO CAPS: 100.0000 mg | ORAL_CAPSULE | Freq: Three times a day (TID) | ORAL | 0 refills | Status: AC

## 2024-04-04 MED ORDER — IPRATROPIUM-ALBUTEROL 0.5-2.5 (3) MG/3ML IN SOLN
RESPIRATORY_TRACT | Status: AC
  Filled 2024-04-04: qty 3

## 2024-04-04 NOTE — ED Triage Notes (Signed)
 Reports worsening chronic cough x 1 week. Hx of sarcoidosis,

## 2024-04-04 NOTE — ED Provider Notes (Signed)
 " [facility] Provider Note   CSN: [id number] Arrival date & time: 04/04/24  9149     Patient presents with: Cough   Connor Baldwin is a 64 y.o. male.   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  No fever or chills.  Cough nonproductive.  Cough seems to wax and wane.  No associated CHF or CAD type symptoms.  No leg swelling or.  Has been using antitussives with limited relief.  Also uses albuterol  as did seem to help somewhat.       Prior to Admission medications  Medication Sig Start Date End Date Taking? Authorizing Provider  albuterol  (PROVENTIL  HFA;VENTOLIN  HFA) 108 (90 Base) MCG/ACT inhaler Inhale into the lungs.    [provider]  benzonatate  (TESSALON ) 100 MG capsule Take 1-2 capsules (100-200 mg total) by mouth 3 (three) times daily as needed for cough. 02/18/21   Christopher Savannah, PA-C  cetirizine  (ZYRTEC  ALLERGY) 10 MG tablet Take 1 tablet (10 mg total) by mouth daily. 02/18/21   Christopher Savannah, PA-C  ergocalciferol (VITAMIN D2) 1.25 MG (50000 UT) capsule Take by mouth.    [provider]  gabapentin (NEURONTIN) 800 MG tablet Take by mouth.    [provider]  hydrOXYzine (ATARAX/VISTARIL) 25 MG tablet  11/06/15   [provider]  ketoconazole (NIZORAL) 2 % shampoo Apply topically.    [provider]  lidocaine  (XYLOCAINE ) 2 % jelly Apply 1 application topically as needed. 05/12/19   Darr, Jacob, PA-C  loratadine (CLARITIN) 10 MG tablet Take by mouth.    [provider]  Melatonin 3 MG TABS Take by mouth.    [provider]  meloxicam  (MOBIC ) 15 MG tablet Take 1 tablet (15 mg total) daily by mouth. 01/21/17   Hyatt, Max T, DPM  Multiple Vitamin (MULTIVITAMIN) capsule Take by mouth.    [provider]  Nitroglycerin  0.4 % OINT Place 1 applicator rectally in the  morning and at bedtime. 05/12/19   Darr, Jacob, PA-C  ondansetron  (ZOFRAN ) 4 MG tablet Take 1 tablet (4 mg total) by mouth every 8 (eight) hours as needed. 05/03/19   Hyatt, Max T, DPM  oxycodone  (OXY-IR) 5 MG capsule Take 5 mg by mouth every 4 (four) hours as needed for pain.    [provider]  pantoprazole (PROTONIX) 40 MG tablet Take 40 mg by mouth.    [provider]  PARoxetine (PAXIL) 40 MG tablet Take by mouth.    [provider]  polyethylene glycol powder (MIRALAX ) 17 GM/SCOOP powder Consume 2 caps daily until you have a bowel movement, then 1 cap daily adjusted to stool consistency 05/12/19   Darr, Jacob, PA-C  potassium chloride (KLOR-CON) 20 MEQ packet Take by mouth.    [provider]  promethazine  (PHENERGAN ) 25 MG tablet Take 1 tablet (25 mg total) by mouth every 8 (eight) hours as needed. 11/19/16   Hyatt, Max T, DPM  promethazine -dextromethorphan (PROMETHAZINE -DM) 6.25-15 MG/5ML syrup Take 5 mLs by mouth at bedtime as needed for cough. 02/18/21   Christopher Savannah, PA-C  pseudoephedrine  (SUDAFED) 30 MG tablet Take 1 tablet (30 mg total) by mouth every 8 (eight) hours as needed for congestion. 02/18/21   Christopher Savannah, PA-C  QUEtiapine (SEROQUEL) 200 MG tablet Take by mouth.    [provider]  senna-docusate (SENOKOT-S) 8.6-50 MG tablet Take by mouth.  [provider]  SUMAtriptan (IMITREX) 100 MG tablet Take by mouth.    [provider]  terazosin (HYTRIN) 2 MG capsule  08/05/16   [provider]  timolol (TIMOPTIC) 0.5 % ophthalmic solution 1 drop Two (2) times a day.    [provider]  tolnaftate (TINACTIN) 1 % powder Apply topically.    [provider]  zolmitriptan (ZOMIG) 5 MG tablet Take 5 mg by mouth as needed for migraine.    [provider]  zolpidem (AMBIEN) 10 MG tablet Take 10 mg by mouth.    [provider]    Allergies: Cholecalciferol, Sulfamethoxazole-trimethoprim, and  Sulfa antibiotics    Review of Systems  All other systems reviewed and are negative.   Updated Vital Signs BP (!) 159/91 (BP Location: Right Arm)   Pulse 87   Temp 97.9 F (36.6 C) (Oral)   Resp 18   SpO2 99%   Physical Exam Vitals and nursing note reviewed.  Constitutional:      General: He is not in acute distress.    Appearance: Normal appearance. He is well-developed. He is not toxic-appearing.  HENT:     Head: Normocephalic and atraumatic.  Eyes:     General: Lids are normal.     Conjunctiva/sclera: Conjunctivae normal.     Pupils: Pupils are equal, round, and reactive to light.  Neck:     Thyroid: No thyroid mass.     Trachea: No tracheal deviation.  Cardiovascular:     Rate and Rhythm: Normal rate and regular rhythm.     Heart sounds: Normal heart sounds. No murmur heard.    No gallop.  Pulmonary:     Effort: Pulmonary effort is normal. No respiratory distress.     Breath sounds: Normal breath sounds. No stridor. No decreased breath sounds, wheezing, rhonchi or rales.  Abdominal:     General: There is no distension.     Palpations: Abdomen is soft.     Tenderness: There is no abdominal tenderness. There is no rebound.  Musculoskeletal:        General: No tenderness. Normal range of motion.     Cervical back: Normal range of motion and neck supple.  Skin:    General: Skin is warm and dry.     Findings: No abrasion or rash.  Neurological:     Mental Status: He is alert and oriented to person, place, and time. Mental status is at baseline.     GCS: GCS eye subscore is 4. GCS verbal subscore is 5. GCS motor subscore is 6.     Cranial Nerves: No cranial nerve deficit.     Sensory: No sensory deficit.     Motor: Motor function is intact.  Psychiatric:        Attention and Perception: Attention normal.        Speech: Speech normal.        Behavior: Behavior normal.     (all labs ordered are listed, but only abnormal results are displayed) Labs Reviewed - No  data to display  EKG: None  Radiology: No results found.   Procedures   Medications Ordered in the ED - No data to display                                  Medical Decision Making Amount and/or Complexity of Data Reviewed Labs: ordered. Radiology: ordered.  Risk Prescription drug management.  Patient's chest x-ray shows nodular opacities.  Recommended CT scanning which was done today of the chest which shows bronchitis and increased mediastinal adenopathy.  Patient does have a pulmonologist at the [facility] he will follow-up with that.  Patient will placed on steroids and given antitussives for his cough.  Will discharge home    Final diagnoses:  None    ED Discharge Orders     None          Dasie Faden, MD 04/04/24 1355  "

## 2024-04-04 NOTE — Discharge Instructions (Addendum)
 You have abnormal nodules on your CAT scan and they need to be reevaluated with a CT scan in 3 to 6 months.  Follow-up with your pulmonologist at the [facility] for this
